# Patient Record
Sex: Male | Born: 1970 | Race: White | Hispanic: No | Marital: Married | Smoking: Never smoker
Health system: Southern US, Community
[De-identification: ages and names within clinical notes are randomized; demographics above are authoritative.]

## PROBLEM LIST (undated history)

## (undated) DIAGNOSIS — L409 Psoriasis, unspecified: Secondary | ICD-10-CM

## (undated) DIAGNOSIS — E162 Hypoglycemia, unspecified: Secondary | ICD-10-CM

## (undated) DIAGNOSIS — N451 Epididymitis: Secondary | ICD-10-CM

## (undated) DIAGNOSIS — T7840XA Allergy, unspecified, initial encounter: Secondary | ICD-10-CM

## (undated) HISTORY — DX: Hypoglycemia, unspecified: E16.2

## (undated) HISTORY — DX: Epididymitis: N45.1

## (undated) HISTORY — DX: Allergy, unspecified, initial encounter: T78.40XA

## (undated) HISTORY — DX: Psoriasis, unspecified: L40.9

---

## 2011-03-19 ENCOUNTER — Other Ambulatory Visit: Payer: Self-pay | Admitting: Family Medicine

## 2011-03-19 ENCOUNTER — Ambulatory Visit (HOSPITAL_COMMUNITY)
Admission: RE | Admit: 2011-03-19 | Discharge: 2011-03-19 | Disposition: A | Discharge status: Home / Self Care | Payer: BC Managed Care – PPO | Source: Ambulatory Visit | Attending: Family Medicine | Admitting: Family Medicine

## 2011-03-19 DIAGNOSIS — N50819 Testicular pain, unspecified: Secondary | ICD-10-CM

## 2011-03-19 DIAGNOSIS — N433 Hydrocele, unspecified: Secondary | ICD-10-CM | POA: Insufficient documentation

## 2011-03-19 DIAGNOSIS — N509 Disorder of male genital organs, unspecified: Secondary | ICD-10-CM | POA: Insufficient documentation

## 2011-03-26 ENCOUNTER — Ambulatory Visit (INDEPENDENT_AMBULATORY_CARE_PROVIDER_SITE_OTHER): Payer: BC Managed Care – PPO | Admitting: Family Medicine

## 2011-03-26 ENCOUNTER — Encounter: Payer: Self-pay | Admitting: Family Medicine

## 2011-03-26 VITALS — BP 120/78 | HR 65 | Temp 98.8°F | Ht 68.5 in | Wt 170.0 lb

## 2011-03-26 DIAGNOSIS — Z0000 Encounter for general adult medical examination without abnormal findings: Secondary | ICD-10-CM

## 2011-03-26 NOTE — Progress Notes (Signed)
  Subjective:    Patient ID: Trevor Hernandez, male    DOB: Jul 16, 1971, 40 y.o.   MRN: [id number]  HPI 40 yr old male to establish with [place] and for a cpx. He feels fine in general. He has chronic epididymitis, and he established with Urology earlier this week. They did a scrotal US that was normal. He gets frequent testicular when he runs a lot or overexerts himself. He is active and exercises regularly. 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 He moved to [place] from [place], [place] just 2 months ago.    Review of Systems  Constitutional: Negative.   HENT: Negative.   Eyes: Negative.   Respiratory: Negative.   Cardiovascular: Negative.   Gastrointestinal: Negative.   Genitourinary: Negative.   Musculoskeletal: Negative.   Skin: Negative.   Neurological: Negative.   Hematological: Negative.   Psychiatric/Behavioral: Negative.        Objective:   Physical Exam  Constitutional: He is oriented to person, place, and time. He appears well-developed and well-nourished. No distress.  HENT:  Head: Normocephalic and atraumatic.  Right Ear: External ear normal.  Left Ear: External ear normal.  Nose: Nose normal.  Mouth/Throat: Oropharynx is clear and moist. No oropharyngeal exudate.  Eyes: Conjunctivae and EOM are normal. Pupils are equal, round, and reactive to light. Right eye exhibits no discharge. Left eye exhibits no discharge. No scleral icterus.  Neck: Neck supple. No JVD present. No tracheal deviation present. No thyromegaly present.  Cardiovascular: Normal rate, regular rhythm, normal heart sounds and intact distal pulses.  Exam reveals no gallop and no friction rub.   No murmur heard. Pulmonary/Chest: Effort normal and breath sounds normal. No respiratory distress. He has no wheezes. He has no rales. He exhibits no tenderness.  Abdominal: Soft. Bowel sounds are normal. He exhibits no distension and no mass. There is no tenderness. There is no rebound and  no guarding.  Musculoskeletal: Normal range of motion. He exhibits no edema and no tenderness.  Lymphadenopathy:    He has no cervical adenopathy.  Neurological: He is alert and oriented to person, place, and time. He has normal reflexes. No cranial nerve deficit. He exhibits normal muscle tone. Coordination normal.  Skin: Skin is warm and dry. No rash noted. He is not diaphoretic. No erythema. No pallor.  Psychiatric: He has a normal mood and affect. His behavior is normal. Judgment and thought content normal.          Assessment & Plan:  Well exam. He will return soon for fasting labs.

## 2011-04-01 ENCOUNTER — Ambulatory Visit: Payer: BC Managed Care – PPO

## 2011-04-01 ENCOUNTER — Other Ambulatory Visit: Payer: BC Managed Care – PPO

## 2011-04-01 ENCOUNTER — Other Ambulatory Visit: Payer: Self-pay | Admitting: Family Medicine

## 2011-04-01 DIAGNOSIS — Z0000 Encounter for general adult medical examination without abnormal findings: Secondary | ICD-10-CM

## 2011-04-01 LAB — URINALYSIS
Bilirubin Urine: NEGATIVE
Hgb urine dipstick: NEGATIVE
Leukocytes, UA: NEGATIVE
Nitrite: NEGATIVE
Total Protein, Urine: NEGATIVE
Urobilinogen, UA: 0.2 (ref 0.0–1.0)

## 2011-04-01 LAB — CBC WITH DIFFERENTIAL/PLATELET
Basophils Absolute: 0 10*3/uL (ref 0.0–0.1)
HCT: 45.7 % (ref 39.0–52.0)
Hemoglobin: 15.8 g/dL (ref 13.0–17.0)
Lymphs Abs: 2.1 10*3/uL (ref 0.7–4.0)
MCHC: 34.5 g/dL (ref 30.0–36.0)
MCV: 94.5 fl (ref 78.0–100.0)
Monocytes Absolute: 0.5 10*3/uL (ref 0.1–1.0)
Monocytes Relative: 7.7 % (ref 3.0–12.0)
Neutro Abs: 3.3 10*3/uL (ref 1.4–7.7)
Platelets: 213 10*3/uL (ref 150.0–400.0)
RDW: 12.5 % (ref 11.5–14.6)

## 2011-04-01 LAB — BASIC METABOLIC PANEL
BUN: 12 mg/dL (ref 6–23)
CO2: 30 mEq/L (ref 19–32)
Chloride: 104 mEq/L (ref 96–112)
Glucose, Bld: 90 mg/dL (ref 70–99)
Potassium: 4.8 mEq/L (ref 3.5–5.1)
Sodium: 141 mEq/L (ref 135–145)

## 2011-04-01 LAB — LIPID PANEL
HDL: 54.2 mg/dL (ref >39.00)
LDL Cholesterol: 78 mg/dL (ref 0–99)
Total CHOL/HDL Ratio: 3
VLDL: 12.4 mg/dL (ref 0.0–40.0)

## 2011-04-01 LAB — TSH: TSH: 0.85 u[IU]/mL (ref 0.35–5.50)

## 2011-04-01 LAB — HEPATIC FUNCTION PANEL
ALT: 13 U/L (ref 0–53)
Bilirubin, Direct: 0.2 mg/dL (ref 0.0–0.3)
Total Bilirubin: 1.2 mg/dL (ref 0.3–1.2)

## 2012-01-16 ENCOUNTER — Telehealth: Payer: Self-pay | Admitting: Family Medicine

## 2012-01-16 DIAGNOSIS — Z0000 Encounter for general adult medical examination without abnormal findings: Secondary | ICD-10-CM

## 2012-01-16 NOTE — Telephone Encounter (Signed)
Add a PSA for V70.0

## 2012-01-16 NOTE — Telephone Encounter (Signed)
Pt has sch a cpx in July and is req to get psa cancer screening added to cpx labs. Pls order necessary labs and advise.

## 2012-01-19 NOTE — Telephone Encounter (Signed)
Added ordered psa to pts cpx labs and notified pt as noted.

## 2012-01-19 NOTE — Telephone Encounter (Signed)
I put the future lab order in, can you call pt and make sure that he does have the lab appointment scheduled?

## 2012-02-12 ENCOUNTER — Other Ambulatory Visit (INDEPENDENT_AMBULATORY_CARE_PROVIDER_SITE_OTHER): Payer: BC Managed Care – PPO

## 2012-02-12 DIAGNOSIS — Z0000 Encounter for general adult medical examination without abnormal findings: Secondary | ICD-10-CM

## 2012-02-12 LAB — POCT URINALYSIS DIPSTICK
Blood, UA: NEGATIVE
Glucose, UA: NEGATIVE
Leukocytes, UA: NEGATIVE
Nitrite, UA: NEGATIVE
Urobilinogen, UA: 0.2
pH, UA: 7.5

## 2012-02-12 LAB — LIPID PANEL
Cholesterol: 174 mg/dL (ref 0–200)
HDL: 58.2 mg/dL (ref >39.00)
LDL Cholesterol: 102 mg/dL — ABNORMAL HIGH (ref 0–99)
Total CHOL/HDL Ratio: 3
Triglycerides: 71 mg/dL (ref 0.0–149.0)
VLDL: 14.2 mg/dL (ref 0.0–40.0)

## 2012-02-12 LAB — BASIC METABOLIC PANEL
BUN: 17 mg/dL (ref 6–23)
Calcium: 9 mg/dL (ref 8.4–10.5)
Creatinine, Ser: 0.9 mg/dL (ref 0.4–1.5)
GFR: 98.09 mL/min (ref >60.00)

## 2012-02-12 LAB — CBC WITH DIFFERENTIAL/PLATELET
Basophils Absolute: 0 10*3/uL (ref 0.0–0.1)
Eosinophils Absolute: 0.2 10*3/uL (ref 0.0–0.7)
Hemoglobin: 15.8 g/dL (ref 13.0–17.0)
Lymphocytes Relative: 37.4 % (ref 12.0–46.0)
MCHC: 34.1 g/dL (ref 30.0–36.0)
Monocytes Relative: 6.9 % (ref 3.0–12.0)
Neutro Abs: 3.3 10*3/uL (ref 1.4–7.7)
Platelets: 200 10*3/uL (ref 150.0–400.0)
RDW: 12.1 % (ref 11.5–14.6)

## 2012-02-12 LAB — PSA: PSA: 0.32 ng/mL (ref 0.10–4.00)

## 2012-02-12 LAB — HEPATIC FUNCTION PANEL
ALT: 14 U/L (ref 0–53)
Bilirubin, Direct: 0.1 mg/dL (ref 0.0–0.3)
Total Bilirubin: 1.3 mg/dL — ABNORMAL HIGH (ref 0.3–1.2)

## 2012-02-16 ENCOUNTER — Telehealth: Payer: Self-pay | Admitting: Family Medicine

## 2012-02-16 ENCOUNTER — Encounter: Payer: Self-pay | Admitting: Family Medicine

## 2012-02-16 ENCOUNTER — Ambulatory Visit (INDEPENDENT_AMBULATORY_CARE_PROVIDER_SITE_OTHER): Payer: BC Managed Care – PPO | Admitting: Family Medicine

## 2012-02-16 ENCOUNTER — Ambulatory Visit: Payer: BC Managed Care – PPO | Admitting: Internal Medicine

## 2012-02-16 VITALS — BP 118/74 | HR 62 | Temp 98.5°F | Wt 166.0 lb

## 2012-02-16 DIAGNOSIS — N419 Inflammatory disease of prostate, unspecified: Secondary | ICD-10-CM

## 2012-02-16 DIAGNOSIS — N390 Urinary tract infection, site not specified: Secondary | ICD-10-CM

## 2012-02-16 LAB — POCT URINALYSIS DIPSTICK
Ketones, UA: NEGATIVE
Protein, UA: NEGATIVE
Spec Grav, UA: 1.01
Urobilinogen, UA: 0.2
pH, UA: 6

## 2012-02-16 MED ORDER — CIPROFLOXACIN HCL 500 MG PO TABS: 500.0000 mg | ORAL_TABLET | Freq: Two times a day (BID) | ORAL | Status: AC

## 2012-02-16 NOTE — Telephone Encounter (Signed)
Caller: Trevor Hernandez/Patient; PCP: Nelwyn Salisbury.; CB#: [phone]; ; ; Call regarding Urinary Pain/Bleeding; Onset 02/02/12; Patient reports mild pain over bladder, severe urgency, frequency. Patient reports that at times he does not have very much output when he goes to the bathroom. Denies blood in urine. Afebrile. Patient reports pain over his abdomen and scrotum as well. Emergent s/s of "Urinary tract symptoms and any flank, low back, penis, or scrotal pain" positive per Urinary Symptoms - Male guideline. Appointment scheduled with Dr. Fabian Sharp 02/16/12 at 1:50pm.

## 2012-02-16 NOTE — Progress Notes (Signed)
  Subjective:    Patient ID: Trevor Hernandez, male    DOB: 10/27/1970, 41 y.o.   MRN: [id number]  HPI Here for 10 days of increased frequency and urgency to urinate. No pain or urethral DC. No fevers. He has never had this before. He had recent cpx labs which included a normal UA and a normal PSA.    Review of Systems  Constitutional: Negative.   Gastrointestinal: Negative.   Genitourinary: Positive for urgency and frequency. Negative for dysuria, hematuria, flank pain, discharge, difficulty urinating and testicular pain.       Objective:   Physical Exam  Constitutional: He appears well-developed and well-nourished.  Genitourinary: Rectum normal and penis normal. Right testis shows no mass, no swelling and no tenderness. Left testis shows no mass, no swelling and no tenderness.       The prostate is swollen, boggy, and tender. No nodules           Assessment & Plan:  Prostatitis. Treat with Cipro. We will recheck at his cpx later this week

## 2012-02-16 NOTE — Progress Notes (Signed)
Quick Note:  I left voice message with normal results. ______ 

## 2012-02-18 ENCOUNTER — Ambulatory Visit (INDEPENDENT_AMBULATORY_CARE_PROVIDER_SITE_OTHER): Payer: BC Managed Care – PPO | Admitting: Family Medicine

## 2012-02-18 ENCOUNTER — Encounter: Payer: Self-pay | Admitting: Family Medicine

## 2012-02-18 VITALS — BP 110/78 | HR 100 | Temp 98.4°F | Ht 67.5 in | Wt 162.0 lb

## 2012-02-18 DIAGNOSIS — Z0000 Encounter for general adult medical examination without abnormal findings: Secondary | ICD-10-CM

## 2012-02-18 NOTE — Progress Notes (Signed)
  Subjective:    Patient ID: Trevor Hernandez, male    DOB: 1971/02/26, 41 y.o.   MRN: [id number]  HPI 41 yr old male for a cpx. He feels well and has no concerns. We saw him several days ago for a prostate infection, and he already feels much better on Cipro.    Review of Systems  Constitutional: Negative.   HENT: Negative.   Eyes: Negative.   Respiratory: Negative.   Cardiovascular: Negative.   Gastrointestinal: Negative.   Genitourinary: Negative.   Musculoskeletal: Negative.   Skin: Negative.   Neurological: Negative.   Hematological: Negative.   Psychiatric/Behavioral: Negative.        Objective:   Physical Exam  Constitutional: He is oriented to person, place, and time. He appears well-developed and well-nourished. No distress.  HENT:  Head: Normocephalic and atraumatic.  Right Ear: External ear normal.  Left Ear: External ear normal.  Nose: Nose normal.  Mouth/Throat: Oropharynx is clear and moist. No oropharyngeal exudate.  Eyes: Conjunctivae and EOM are normal. Pupils are equal, round, and reactive to light. Right eye exhibits no discharge. Left eye exhibits no discharge. No scleral icterus.  Neck: Neck supple. No JVD present. No tracheal deviation present. No thyromegaly present.  Cardiovascular: Normal rate, regular rhythm, normal heart sounds and intact distal pulses.  Exam reveals no gallop and no friction rub.   No murmur heard. Pulmonary/Chest: Effort normal and breath sounds normal. No respiratory distress. He has no wheezes. He has no rales. He exhibits no tenderness.  Abdominal: Soft. Bowel sounds are normal. He exhibits no distension and no mass. There is no tenderness. There is no rebound and no guarding.  Genitourinary: Rectum normal, prostate normal and penis normal. Guaiac negative stool. No penile tenderness.  Musculoskeletal: Normal range of motion. He exhibits no edema and no tenderness.  Lymphadenopathy:    He has no cervical adenopathy.    Neurological: He is alert and oriented to person, place, and time. He has normal reflexes. No cranial nerve deficit. He exhibits normal muscle tone. Coordination normal.  Skin: Skin is warm and dry. No rash noted. He is not diaphoretic. No erythema. No pallor.  Psychiatric: He has a normal mood and affect. His behavior is normal. Judgment and thought content normal.          Assessment & Plan:  Well exam. Finish out the antibiotic.

## 2012-09-18 ENCOUNTER — Encounter: Payer: Self-pay | Admitting: Internal Medicine

## 2012-09-18 ENCOUNTER — Ambulatory Visit (INDEPENDENT_AMBULATORY_CARE_PROVIDER_SITE_OTHER): Payer: BC Managed Care – PPO | Admitting: Internal Medicine

## 2012-09-18 VITALS — BP 122/74 | HR 93 | Temp 98.7°F | Wt 167.0 lb

## 2012-09-18 DIAGNOSIS — J019 Acute sinusitis, unspecified: Secondary | ICD-10-CM

## 2012-09-18 MED ORDER — DOXYCYCLINE HYCLATE 100 MG PO TABS: 100.0000 mg | ORAL_TABLET | Freq: Two times a day (BID) | ORAL | Status: DC

## 2012-09-18 NOTE — Patient Instructions (Addendum)
Use over-the-counter  "cold" medicines  such as "Tylenol cold" , "Advil cold",  "Mucinex" or" Mucinex D"  for cough and congestion.   Avoid decongestants if you have high blood pressure and use "Afrin" nasal spray for nasal congestion as directed instead. Use" Delsym" or" Robitussin" cough syrup varietis for cough.  You can use plain "Tylenol" or "Advil" for fever, chills and achyness.  Please, make an appointment if you are not better or if you're worse.  

## 2012-09-18 NOTE — Progress Notes (Signed)
  Subjective:    Patient ID: Trevor Hernandez, male    DOB: 04/22/1971, 42 y.o.   MRN: [id number]  Cough This is a new problem. The current episode started in the past 7 days. The problem has been gradually worsening. The cough is productive of sputum. Associated symptoms include chills. Pertinent negatives include no fever, rash or wheezing. He has tried OTC cough suppressant for the symptoms. The treatment provided mild relief. There is no history of asthma.      Review of Systems  Constitutional: Positive for chills. Negative for fever.  Respiratory: Positive for cough. Negative for wheezing.   Skin: Negative for rash.       Objective:   Physical Exam  Constitutional: He is oriented to person, place, and time. He appears well-developed. No distress.  HENT:  Mouth/Throat: Oropharynx is clear and moist.  Eyes: Conjunctivae are normal. Pupils are equal, round, and reactive to light. Right eye exhibits no discharge.  eryth throat  Neck: Normal range of motion. No JVD present. No thyromegaly present.  Cardiovascular: Normal rate, regular rhythm, normal heart sounds and intact distal pulses.  Exam reveals no gallop and no friction rub.   No murmur heard. Pulmonary/Chest: Effort normal and breath sounds normal. No respiratory distress. He has no wheezes. He has no rales. He exhibits no tenderness.  Abdominal: Soft. Bowel sounds are normal. He exhibits no distension and no mass. There is no tenderness. There is no rebound and no guarding.  Musculoskeletal: Normal range of motion. He exhibits no edema and no tenderness.  Lymphadenopathy:    He has no cervical adenopathy.  Neurological: He is alert and oriented to person, place, and time. He has normal reflexes. No cranial nerve deficit. He exhibits normal muscle tone. Coordination normal.  Skin: Skin is warm and dry. No rash noted.  Psychiatric: He has a normal mood and affect. His behavior is normal. Judgment and thought content normal.           Assessment & Plan:

## 2012-09-20 ENCOUNTER — Telehealth: Payer: Self-pay | Admitting: Family Medicine

## 2012-09-20 NOTE — Telephone Encounter (Signed)
[facility] Triage Call Report Triage Record Num: [id number] Operator: Hyman Bower Patient Name: Trevor Hernandez Call Date & Time: 09/18/2012 8:32:24AM Patient Phone: [phone] PCP: Tera Mater. Clent Ridges Patient Gender: Male PCP Fax : [phone] Patient DOB: 1970/10/28 [facility] Name[facility] Reason for Call: Caller: Randy/Patient; PCP: Gershon Crane [facility]); CB#: [phone]; Call regarding Cough/Congestion, had the cough x 10 days, coughed up some blood, low grade fever, feels dizzy with body aches. Patient has received a flu shot this season. Patient reports that during the night he did have some shortness of breath and wheezing, but that has dissipated this morning since he is now up and walking around. Patient reports he does have dizziness at times, but he can still walk like normal. Reports sinus pain and pressure. Triaged per Upper Respiratory Infection guideline, disposition: see provider within 24 hours for "Pressure/pain above or below eyes, near ears over sinuses and yellow-green drainage from nose or down back of throat." Appointment scheduled 09/18/12 at 11:00am with Dr. Posey Rea. Advised patient to call back if any difficulty breathing or severe dizziness occurs. Patient verbalized understanding. Protocol(s) Used: Flu-Like Symptoms Protocol(s) Used: Upper Respiratory Infection (URI) Recommended Outcome per Protocol: See Provider within 24 hours Reason for Outcome: Gradual onset of upper respiratory illness signs and symptoms (If there is any doubt that symptoms may be an upper respiratory illness, use this guideline, Flu-Like Symptoms ) Pressure/pain above or below eyes, near ears over sinuses (may also be described as fullness, worsens when bending forward, teeth or eye pain) AND yellow-green drainage from nose or down back of throat. Care Advice: ~ 02/15/

## 2013-02-11 ENCOUNTER — Telehealth: Payer: Self-pay | Admitting: Family Medicine

## 2013-02-11 NOTE — Telephone Encounter (Signed)
I spoke to someone that answered the phone. Pt did not feel like talking. I advised that if his nose starts bleeding again, he needs to go to Urgent Care. We do not have any openings here at the office today.

## 2013-02-11 NOTE — Telephone Encounter (Signed)
Patient Information:  Caller Name: Harvie Heck  Phone: [phone]  Patient: Trevor Hernandez, Trevor Hernandez  Gender: Male  DOB: 20-Jul-1971  Age: 41 Years  PCP: Gershon Crane [facility])  Office Follow Up:  Does the office need to follow up with this patient?: Yes  Instructions For The Office: Pt of Dr. Clent Ridges. No appts available today for that MD. RN does not have permission to schedule with other Provider. Please call and advise pt.   Symptoms  Reason For Call & Symptoms: Pt has had 3 hard to stop nosebleeds over the past 3 days. No fever. No cold sx. No hx of nosebleeds.  Reviewed Health History In EMR: Yes  Reviewed Medications In EMR: Yes  Reviewed Allergies In EMR: Yes  Reviewed Surgeries / Procedures: Yes  Date of Onset of Symptoms: 02/08/2013  Guideline(s) Used:  Nosebleed  Disposition Per Guideline:   See Today in Office  Reason For Disposition Reached:   Bleeding recurs 3 or more times in 24 hours despite direct pressure  Advice Given:  N/A  Patient Will Follow Care Advice:  YES

## 2013-02-12 ENCOUNTER — Ambulatory Visit (INDEPENDENT_AMBULATORY_CARE_PROVIDER_SITE_OTHER): Payer: BC Managed Care – PPO | Admitting: Family Medicine

## 2013-02-12 ENCOUNTER — Encounter: Payer: Self-pay | Admitting: Family Medicine

## 2013-02-12 VITALS — BP 124/90 | Temp 97.3°F | Wt 163.0 lb

## 2013-02-12 DIAGNOSIS — R040 Epistaxis: Secondary | ICD-10-CM

## 2013-02-12 NOTE — Progress Notes (Signed)
   Nature conservation officer at [facility]: [place] [address] [place] [address] Phone: [phone] Fax: [phone]  Date:  02/12/2013   Name:  Sanchez Hemmer   DOB:  [birth year]   MRN:  [id number] Gender: male Age: 42 y.o.  Primary Physician:  Nelwyn Salisbury, MD  Evaluating MD: Hannah Beat, MD   Chief Complaint: Epistaxis   History of Present Illness:  Arien Benincasa is a 42 y.o. pleasant patient who presents with the following:  3 days ago, blood came gushing out. Never had bad one before. Cleaned up in about 30 minutes. Blew very lightly and very lightly. Felt some loose mucous. Big one again.     Patient Active Problem List   Diagnosis Date Noted  . Acute sinusitis 09/18/2012    Past Medical History  Diagnosis Date  . Epididymitis     sees Dr. Natalia Leatherwood     No past surgical history on file.  History   Social History  . Marital Status: Married    Spouse Name: N/A    Number of Children: N/A  . Years of Education: N/A   Occupational History  . Not on file.   Social History Main Topics  . Smoking status: Never Smoker   . Smokeless tobacco: Never Used  . Alcohol Use: 3.5 oz/week    7 drink(s) per week  . Drug Use: No  . Sexually Active: Yes   Other Topics Concern  . Not on file   Social History Narrative  . No narrative on file    Family History  Problem Relation Age of Onset  . Diabetes Father     Allergies  Allergen Reactions  . Penicillins     Pt's wife is highly sensitive, so he would not want to take this.    Medication list has been reviewed and updated.  Outpatient Prescriptions Prior to Visit  Medication Sig Dispense Refill  . doxycycline (VIBRA-TABS) 100 MG tablet Take 1 tablet (100 mg total) by mouth 2 (two) times daily.  20 tablet  0   No facility-administered medications prior to visit.    Review of Systems:   GEN: No acute illnesses, no fevers, chills. GI: No n/v/d, eating normally Pulm: No SOB Interactive  and getting along well at home.  Otherwise, ROS is as per the HPI.   Physical Examination: BP 124/90  Temp(Src) 97.3 F (36.3 C) (Oral)  Wt 163 lb (73.936 kg)  BMI 25.14 kg/m2  Ideal Body Weight:     GEN: WDWN, NAD, Non-toxic, Alert & Oriented x 3 HEENT: Atraumatic, Normocephalic. Scant blood clot in L nares Ears and Nose: No external deformity. EXTR: No clubbing/cyanosis/edema NEURO: Normal gait.  PSYCH: Normally interactive. Conversant. Not depressed or anxious appearing.  Calm demeanor.    Assessment and Plan: Epistaxis   Afrin bid x 3 days Reviewed how to compress bride of nose Tea bag if he has them at the time  If recurrent cautery likely next step  Signed, Rilynne Lonsway T. Joni Colegrove, MD 02/12/2013 9:45 AM

## 2013-04-13 ENCOUNTER — Encounter: Payer: Self-pay | Admitting: Family Medicine

## 2013-04-13 ENCOUNTER — Ambulatory Visit (INDEPENDENT_AMBULATORY_CARE_PROVIDER_SITE_OTHER): Payer: BC Managed Care – PPO | Admitting: Family Medicine

## 2013-04-13 VITALS — BP 120/80 | HR 61 | Temp 98.3°F | Ht 67.75 in | Wt 166.0 lb

## 2013-04-13 DIAGNOSIS — R05 Cough: Secondary | ICD-10-CM

## 2013-04-13 DIAGNOSIS — Z0000 Encounter for general adult medical examination without abnormal findings: Secondary | ICD-10-CM

## 2013-04-13 DIAGNOSIS — Z23 Encounter for immunization: Secondary | ICD-10-CM

## 2013-04-13 LAB — LIPID PANEL
Total CHOL/HDL Ratio: 3
Triglycerides: 90 mg/dL (ref 0.0–149.0)

## 2013-04-13 LAB — HEPATIC FUNCTION PANEL
ALT: 15 U/L (ref 0–53)
AST: 18 U/L (ref 0–37)
Bilirubin, Direct: 0.2 mg/dL (ref 0.0–0.3)
Total Bilirubin: 1.3 mg/dL — ABNORMAL HIGH (ref 0.3–1.2)

## 2013-04-13 LAB — CBC WITH DIFFERENTIAL/PLATELET
Basophils Relative: 0.2 % (ref 0.0–3.0)
Eosinophils Absolute: 0.1 10*3/uL (ref 0.0–0.7)
MCHC: 34.2 g/dL (ref 30.0–36.0)
MCV: 94 fl (ref 78.0–100.0)
Monocytes Absolute: 0.6 10*3/uL (ref 0.1–1.0)
Neutrophils Relative %: 66.7 % (ref 43.0–77.0)
RBC: 4.96 Mil/uL (ref 4.22–5.81)
RDW: 12.5 % (ref 11.5–14.6)

## 2013-04-13 LAB — POCT URINALYSIS DIPSTICK
Blood, UA: NEGATIVE
Ketones, UA: NEGATIVE
Protein, UA: NEGATIVE
Spec Grav, UA: 1.01
pH, UA: 6.5

## 2013-04-13 LAB — BASIC METABOLIC PANEL
CO2: 27 mEq/L (ref 19–32)
Chloride: 102 mEq/L (ref 96–112)
Creatinine, Ser: 0.8 mg/dL (ref 0.4–1.5)
Sodium: 137 mEq/L (ref 135–145)

## 2013-04-13 MED ORDER — TRIAMCINOLONE ACETONIDE 0.1 % EX CREA: TOPICAL_CREAM | Freq: Two times a day (BID) | CUTANEOUS | Status: DC

## 2013-04-13 NOTE — Progress Notes (Signed)
  Subjective:    Patient ID: Trevor Hernandez, male    DOB: 1970-08-30, 42 y.o.   MRN: [id number]  HPI 42 yr old male for a cpx. He feels well exept for 2 things. First he has a chronic cough that started 7 years ago after he had a left sided pneumonia. Since then every day or two when he gets up in the mornings he feels a rattle or some congestion in the left chest area. He then coughs up a small amount of clear or yellow sputum, and the congestion goes away. He never sees blood or dark sputum. No chest pain or fever. He has never used tobacco. Second for several years he has had small itchy scaly spots of skin on the front of both knees. No other skin rashes.    Review of Systems  Constitutional: Negative.   HENT: Negative.   Eyes: Negative.   Respiratory: Positive for cough. Negative for apnea, choking, chest tightness, shortness of breath, wheezing and stridor.   Cardiovascular: Negative.   Gastrointestinal: Negative.   Genitourinary: Negative.   Musculoskeletal: Negative.   Skin: Positive for rash. Negative for color change, pallor and wound.  Neurological: Negative.   Psychiatric/Behavioral: Negative.        Objective:   Physical Exam  Constitutional: He is oriented to person, place, and time. He appears well-developed and well-nourished. No distress.  HENT:  Head: Normocephalic and atraumatic.  Right Ear: External ear normal.  Left Ear: External ear normal.  Nose: Nose normal.  Mouth/Throat: Oropharynx is clear and moist. No oropharyngeal exudate.  Eyes: Conjunctivae and EOM are normal. Pupils are equal, round, and reactive to light. Right eye exhibits no discharge. Left eye exhibits no discharge. No scleral icterus.  Neck: Neck supple. No JVD present. No tracheal deviation present. No thyromegaly present.  Cardiovascular: Normal rate, regular rhythm, normal heart sounds and intact distal pulses.  Exam reveals no gallop and no friction rub.   No murmur  heard. Pulmonary/Chest: Effort normal and breath sounds normal. No respiratory distress. He has no wheezes. He has no rales. He exhibits no tenderness.  Abdominal: Soft. Bowel sounds are normal. He exhibits no distension and no mass. There is no tenderness. There is no rebound and no guarding.  Genitourinary: Rectum normal, prostate normal and penis normal. Guaiac negative stool. No penile tenderness.  Musculoskeletal: Normal range of motion. He exhibits no edema and no tenderness.  Lymphadenopathy:    He has no cervical adenopathy.  Neurological: He is alert and oriented to person, place, and time. He has normal reflexes. No cranial nerve deficit. He exhibits normal muscle tone. Coordination normal.  Skin: Skin is warm and dry. He is not diaphoretic. No erythema. No pallor.  Both anterior knees have a small patch of macular pink scaly skin   Psychiatric: He has a normal mood and affect. His behavior is normal. Judgment and thought content normal.          Assessment & Plan:  wll exam. Get fasting labs. He may have some scarring in the lung from the pneumonia, but we will get a CXR soon to look at this. He has mild psoriasis, and he will use Triamcinolone cream on this prn.

## 2013-04-14 ENCOUNTER — Ambulatory Visit (INDEPENDENT_AMBULATORY_CARE_PROVIDER_SITE_OTHER)
Admission: RE | Admit: 2013-04-14 | Discharge: 2013-04-14 | Disposition: A | Discharge status: Home / Self Care | Payer: BC Managed Care – PPO | Source: Ambulatory Visit | Attending: Family Medicine | Admitting: Family Medicine

## 2013-04-14 DIAGNOSIS — R05 Cough: Secondary | ICD-10-CM

## 2013-04-15 NOTE — Progress Notes (Signed)
Quick Note:  I left voice message with results. ______ 

## 2013-06-14 ENCOUNTER — Encounter: Payer: Self-pay | Admitting: Family Medicine

## 2013-06-14 ENCOUNTER — Ambulatory Visit (INDEPENDENT_AMBULATORY_CARE_PROVIDER_SITE_OTHER): Payer: BC Managed Care – PPO | Admitting: Family Medicine

## 2013-06-14 ENCOUNTER — Telehealth: Payer: Self-pay | Admitting: Family Medicine

## 2013-06-14 VITALS — BP 110/78 | HR 85 | Temp 98.4°F | Wt 168.0 lb

## 2013-06-14 DIAGNOSIS — R002 Palpitations: Secondary | ICD-10-CM

## 2013-06-14 NOTE — Progress Notes (Signed)
Pre visit review using our clinic review tool, if applicable. No additional management support is needed unless otherwise documented below in the visit note. 

## 2013-06-14 NOTE — Progress Notes (Signed)
  Subjective:    Patient ID: Trevor Hernandez, male    DOB: Jan 13, 1971, 42 y.o.   MRN: [id number]  HPI Here for an episode of palpitations last night. He has never felt this before. He is active and swims for exercise at least 5 days a week. He was here for a cpx with labs 2 months ago, and everything came out fine. He did consume a lot more caffeine than usual yesterday. He had 3 cups of coffee in the morning and then had a few more in the afternoon, which is quite unusual for him. He went to sleep and then about one hour later was awakened by fluttering in the chest. He had no chest pain or SOB. He did feel a little faint and a little nauseated briefly but all this resolved in about 15 minutes. He went back to sleep and woke up this morning feeling fine. He has felt fine ever since.    Review of Systems  Constitutional: Negative.   Respiratory: Negative.   Cardiovascular: Positive for palpitations. Negative for chest pain and leg swelling.  Neurological: Negative.        Objective:   Physical Exam  Constitutional: He appears well-developed and well-nourished. No distress.  Neck: No thyromegaly present.  Cardiovascular: Normal rate, regular rhythm, normal heart sounds and intact distal pulses.   EKG is normal   Pulmonary/Chest: Effort normal and breath sounds normal.  Lymphadenopathy:    He has no cervical adenopathy.          Assessment & Plan:  He had a transient episode of palpitations which was likely triggered by consuming more caffeine than usual. He seems back to normal now. He will rest today and avoid using much caffeine. Recheck prn

## 2013-06-14 NOTE — Telephone Encounter (Signed)
[facility] Triage Call Report Triage Record Num: [id number] Operator: Migdalia Dk Patient Name: Trevor Hernandez Call Date & Time: 06/14/2013 6:12:20AM Patient Phone: [phone] PCP: Tera Mater. Clent Ridges Patient Gender: Male PCP Fax : [phone] Patient DOB: Sep 26, 1970 [facility] Name[facility] Reason for Call: Caller: Stanford Breed; PCP: Nelwyn Salisbury.; CB#: [phone]; Call regarding Heart palpitations; Woke out of sleep with heart racing, and toes tingling this a.m. " I got up and when I did I got dizzy and had to lay down on the floor." Episode of dizziness 10-15 minutes, "my heartrate stayed off for about an hour, but then it went away." No palpitations this a.m., no chest pain, no diff. breathing. Patient with no symptoms at all now. Appointment with Dr. Abran Cantor given today at 0930. Protocol(s) Used: Irregular Heartbeat Recommended Outcome per Protocol: See Provider within 2 Weeks Reason for Outcome: All other situations Care Advice: ~

## 2013-08-15 ENCOUNTER — Ambulatory Visit (INDEPENDENT_AMBULATORY_CARE_PROVIDER_SITE_OTHER): Payer: BC Managed Care – PPO | Admitting: Family Medicine

## 2013-08-15 ENCOUNTER — Encounter: Payer: Self-pay | Admitting: Family Medicine

## 2013-08-15 VITALS — BP 114/70 | HR 66 | Temp 98.0°F | Wt 165.0 lb

## 2013-08-15 DIAGNOSIS — R059 Cough, unspecified: Secondary | ICD-10-CM

## 2013-08-15 DIAGNOSIS — R05 Cough: Secondary | ICD-10-CM

## 2013-08-15 NOTE — Progress Notes (Signed)
   Subjective:    Patient ID: Trevor Hernandez, male    DOB: 08-25-1970, 43 y.o.   MRN: [id number]  HPI Here to discuss a chronic patch of congestion in the left chest which often makes him cough. This has been present for about a year. The cough is dry. No chest pain or fever. We got a CXR last year which was normal.    Review of Systems  Constitutional: Negative.   Respiratory: Positive for cough and chest tightness. Negative for wheezing.   Cardiovascular: Negative.   Gastrointestinal: Negative.        Objective:   Physical Exam  Constitutional: He appears well-developed and well-nourished. No distress.  Cardiovascular: Normal rate, regular rhythm, normal heart sounds and intact distal pulses.   Pulmonary/Chest: Effort normal and breath sounds normal.          Assessment & Plan:  We will evaluate with a chest CT.

## 2013-08-15 NOTE — Progress Notes (Signed)
Pre visit review using our clinic review tool, if applicable. No additional management support is needed unless otherwise documented below in the visit note. 

## 2013-08-19 NOTE — Addendum Note (Signed)
Addended by: Alysia Penna A on: 08/19/2013 08:52 AM   Modules accepted: Orders

## 2013-08-25 ENCOUNTER — Ambulatory Visit (INDEPENDENT_AMBULATORY_CARE_PROVIDER_SITE_OTHER)
Admission: RE | Admit: 2013-08-25 | Discharge: 2013-08-25 | Disposition: A | Discharge status: Home / Self Care | Payer: BC Managed Care – PPO | Source: Ambulatory Visit | Attending: Family Medicine | Admitting: Family Medicine

## 2013-08-25 DIAGNOSIS — R059 Cough, unspecified: Secondary | ICD-10-CM

## 2013-08-25 DIAGNOSIS — R05 Cough: Secondary | ICD-10-CM

## 2013-09-06 ENCOUNTER — Other Ambulatory Visit: Payer: BC Managed Care – PPO

## 2013-09-07 ENCOUNTER — Inpatient Hospital Stay: Admission: RE | Admit: 2013-09-07 | Payer: BC Managed Care – PPO | Source: Ambulatory Visit

## 2013-09-09 ENCOUNTER — Ambulatory Visit (INDEPENDENT_AMBULATORY_CARE_PROVIDER_SITE_OTHER)
Admission: RE | Admit: 2013-09-09 | Discharge: 2013-09-09 | Disposition: A | Discharge status: Home / Self Care | Payer: BC Managed Care – PPO | Source: Ambulatory Visit | Attending: Family Medicine | Admitting: Family Medicine

## 2013-09-09 DIAGNOSIS — R05 Cough: Secondary | ICD-10-CM

## 2013-09-09 DIAGNOSIS — R059 Cough, unspecified: Secondary | ICD-10-CM

## 2013-09-09 MED ORDER — IOHEXOL 300 MG/ML  SOLN
80.0000 mL | Freq: Once | INTRAMUSCULAR | Status: AC | PRN
  Administered 2013-09-09: 80 mL via INTRAVENOUS

## 2013-09-14 ENCOUNTER — Institutional Professional Consult (permissible substitution): Payer: BC Managed Care – PPO | Admitting: Emergency Medicine

## 2013-09-21 ENCOUNTER — Encounter: Payer: Self-pay | Admitting: Emergency Medicine

## 2013-09-21 ENCOUNTER — Ambulatory Visit (INDEPENDENT_AMBULATORY_CARE_PROVIDER_SITE_OTHER): Payer: BC Managed Care – PPO | Admitting: Emergency Medicine

## 2013-09-21 VITALS — BP 120/78 | HR 59 | Ht 68.0 in | Wt 164.8 lb

## 2013-09-21 DIAGNOSIS — J302 Other seasonal allergic rhinitis: Secondary | ICD-10-CM | POA: Insufficient documentation

## 2013-09-21 DIAGNOSIS — J309 Allergic rhinitis, unspecified: Secondary | ICD-10-CM

## 2013-09-21 NOTE — Progress Notes (Signed)
Subjective:    Patient ID: Trevor Hernandez, male   DOB: July 13, 1971, 43 y.o.   MRN: [id number]  HPI 43 yo never smoker with little PMH, referred today for evaluation of cough. He had a L PNA in  2005 in [place]. He has been well since with the exception of cough and L sided discomfort in setting of viral URI's or flu. For about the last year he has been able to produce a bit of phlegm,  more clearing his throat/chest than true cough. He occasionally has some mild nasal congestion that has been more noticeable since he moved to [place].    Review of Systems  Constitutional: Negative for fever and unexpected weight change.  HENT: Negative for congestion, dental problem, ear pain, nosebleeds, postnasal drip, rhinorrhea, sinus pressure, sneezing, sore throat and trouble swallowing.   Eyes: Negative for redness and itching.  Respiratory: Positive for cough. Negative for chest tightness, shortness of breath and wheezing.   Cardiovascular: Negative for palpitations and leg swelling.  Gastrointestinal: Negative for nausea and vomiting.  Genitourinary: Negative for dysuria.  Musculoskeletal: Negative for joint swelling.  Skin: Negative for rash.  Neurological: Negative for headaches.  Hematological: Does not bruise/bleed easily.  Psychiatric/Behavioral: Negative for dysphoric mood. The patient is not nervous/anxious.     Past Medical History  Diagnosis Date  . Epididymitis     sees Dr. Rolan Bucco      Family History  Problem Relation Age of Onset  . Diabetes Father      History   Social History  . Marital Status: Married    Spouse Name: N/A    Number of Children: N/A  . Years of Education: N/A   Occupational History  . Not on file.   Social History Main Topics  . Smoking status: Never Smoker   . Smokeless tobacco: Never Used  . Alcohol Use: 3.5 oz/week    7 drink(s) per week     Comment: glass of wine or beer in the evening  . Drug Use: No  . Sexual Activity: Yes   Other  Topics Concern  . Not on file   Social History Narrative  . No narrative on file     Allergies  Allergen Reactions  . Penicillins     Pt's wife is highly sensitive, so he would not want to take this.     Outpatient Prescriptions Prior to Visit  Medication Sig Dispense Refill  . triamcinolone cream (KENALOG) 0.1 % Apply topically 2 (two) times daily.  45 g  5   No facility-administered medications prior to visit.         Objective:   Physical Exam Filed Vitals:   09/21/13 1533  BP: 120/78  Pulse: 59  Height: 5\' 8"  (1.727 m)  Weight: 164 lb 12.8 oz (74.753 kg)  SpO2: 99%   Gen: Pleasant, well-nourished, in no distress,  normal affect  ENT: No lesions,  mouth clear,  oropharynx clear, no postnasal drip  Neck: No JVD, no TMG, no carotid bruits  Lungs: No use of accessory muscles, clear without rales or rhonchi  Cardiovascular: RRR, heart sounds normal, no murmur or gallops, no peripheral edema  Musculoskeletal: No deformities, no cyanosis or clubbing  Neuro: alert, non focal  Skin: Warm, no lesions or rashes    09/13/13 --  COMPARISON: None.  FINDINGS:  Mediastinal lymph nodes measure up to 10 mm in the low left  paratracheal station. No hilar or axillary adenopathy. Heart size  normal. No pericardial  effusion.  Biapical pleural parenchymal scarring. Minimal dependent atelectasis  bilaterally. Lungs are otherwise clear. No pleural fluid. Airway is  unremarkable.  Incidental imaging of the upper abdomen shows the visualized  portions of the liver, adrenal glands, kidneys, spleen, pancreas,  stomach and bowel to be grossly unremarkable. No upper abdominal  adenopathy. No worrisome lytic or sclerotic lesions.  IMPRESSION:  No acute findings. No findings to explain the patient's given  symptoms      Assessment & Plan:  Seasonal allergies These are not severe, but he makes enough mucous to be able to localize to the L side of his chest. I suspect that he  clears mucous less effectively from this side due to his prior L CAP in 2005. I can't see an anatomical correlate on CT scan, but this could be too small to see. I suspect that the mucous could be decreased or eliminated by taking an anti-histamine. He is going to try this to see if it helps. I don't think he needs f/u CT scans or any other w/u unless cough or sputum production increase. I discussed with him that he may be at a small increased risk for recurrent L sided PNA at some point in the future.

## 2013-09-21 NOTE — Assessment & Plan Note (Signed)
These are not severe, but he makes enough mucous to be able to localize to the L side of his chest. I suspect that he clears mucous less effectively from this side due to his prior L CAP in 2005. I can't see an anatomical correlate on CT scan, but this could be too small to see. I suspect that the mucous could be decreased or eliminated by taking an anti-histamine. He is going to try this to see if it helps. I don't think he needs f/u CT scans or any other w/u unless cough or sputum production increase. I discussed with him that he may be at a small increased risk for recurrent L sided PNA at some point in the future.

## 2014-02-16 IMAGING — CT CT CHEST W/ CM
2 of 4 series · 15 of 36 positions shown, 18 images · IV contrast (Omnipaque 300)
Comparison: None.

CLINICAL DATA: Productive cough with left chest congestion.

EXAM:
CT CHEST WITH CONTRAST
TECHNIQUE: Multidetector CT imaging of the chest was performed during
intravenous contrast administration.
CONTRAST:  80mL OMNIPAQUE IOHEXOL 300 MG/ML  SOLN

[Series 2: chest routine with · axial · 0.74mm/px · z∈[-344,-58]mm · 12 of 69 slices shown, 15 images]
[im 6/69  mediastinal]
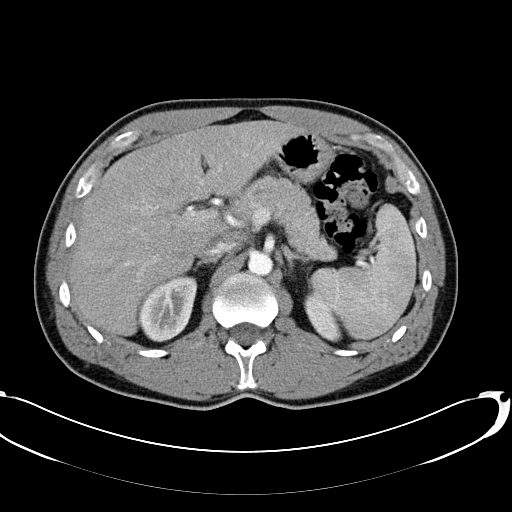
[im 6/69  lung]
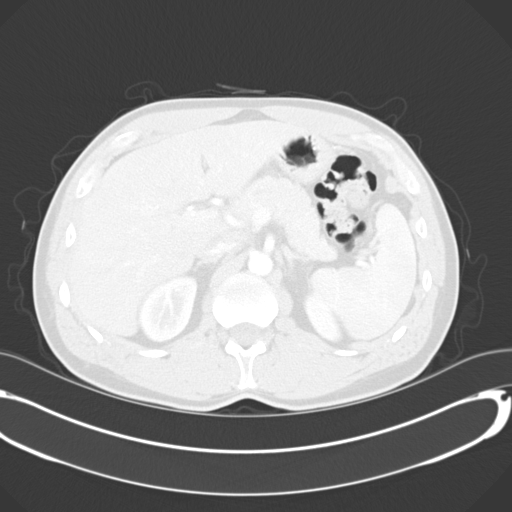
[im 11/69  lung]
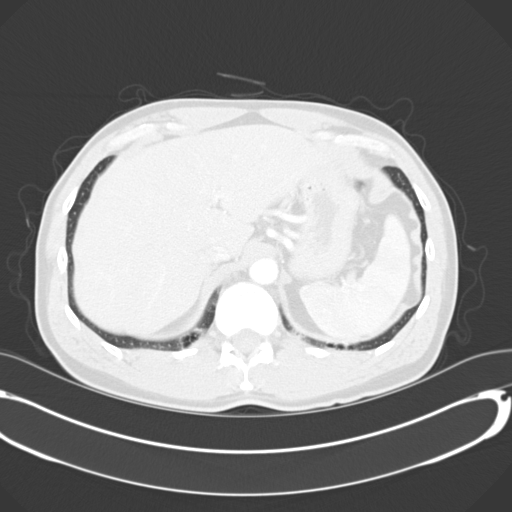
[im 16/69  lung]
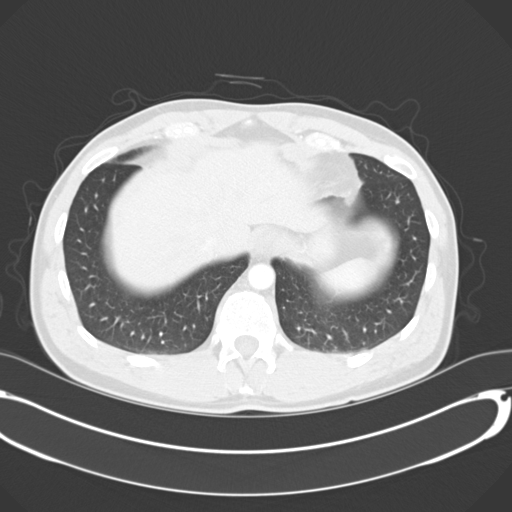
[im 21/69  lung]
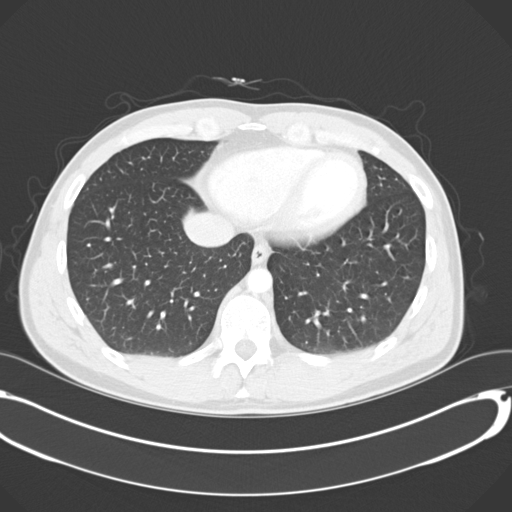
[im 27/69  mediastinal]
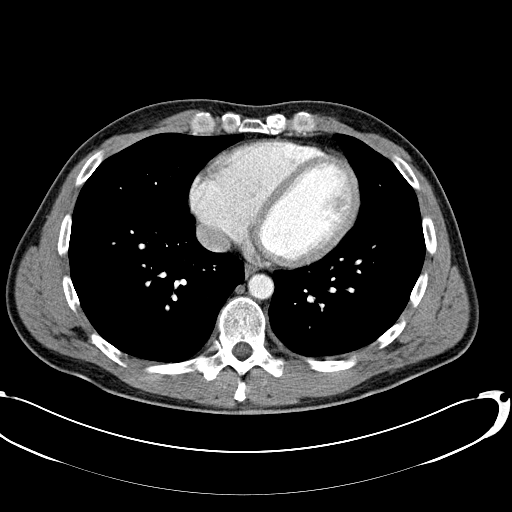
[im 27/69  lung]
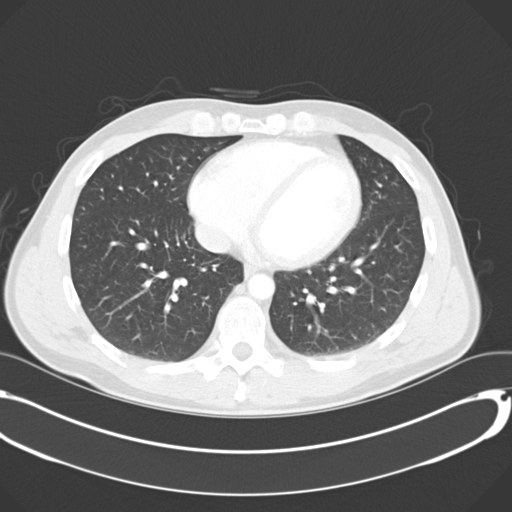
[im 32/69  lung]
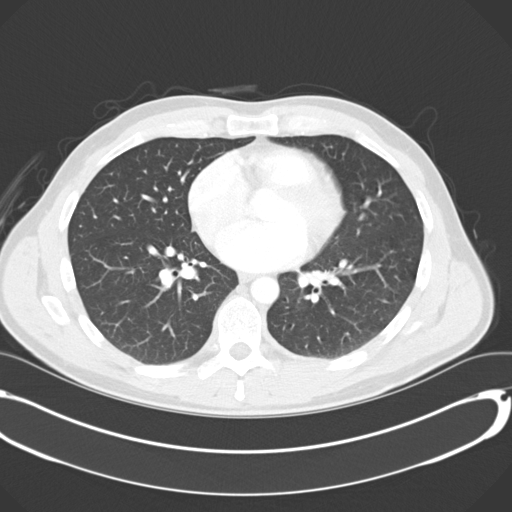
[im 37/69  lung]
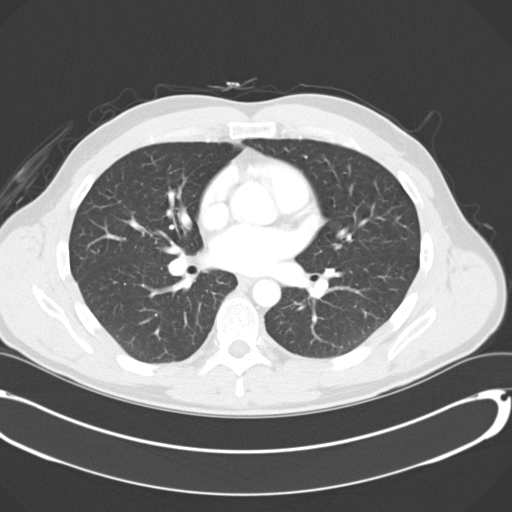
[im 42/69  lung]
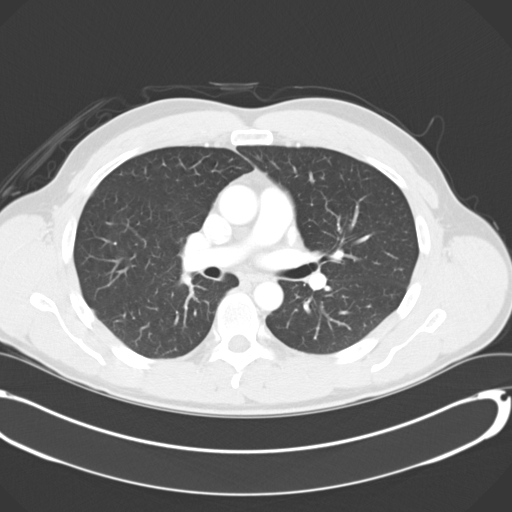
[im 48/69  mediastinal]
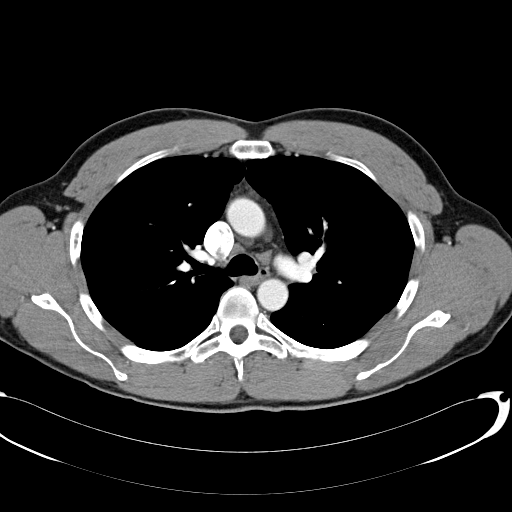
[im 48/69  lung]
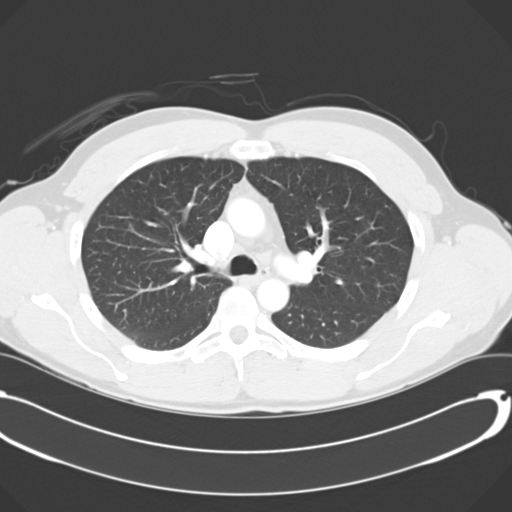
[im 53/69  lung]
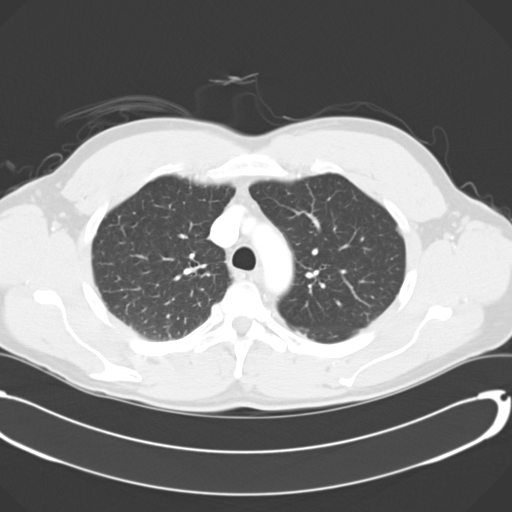
[im 58/69  lung]
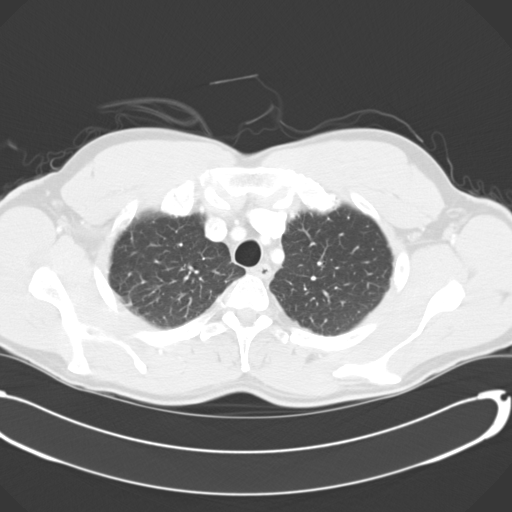
[im 63/69  lung]
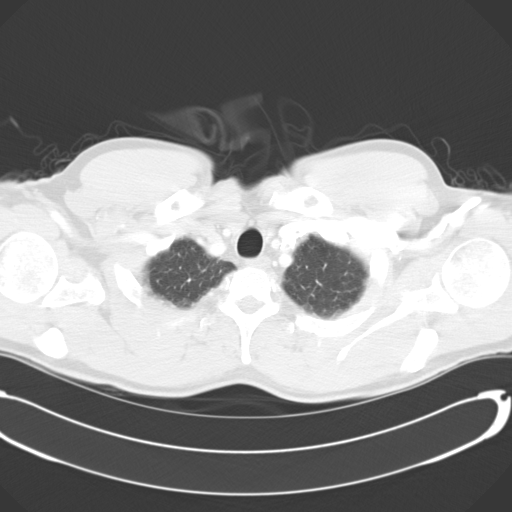

[Series 602: cor · coronal · 0.74mm/px · 3 of 110 slices shown]
[im 22/110  lung]
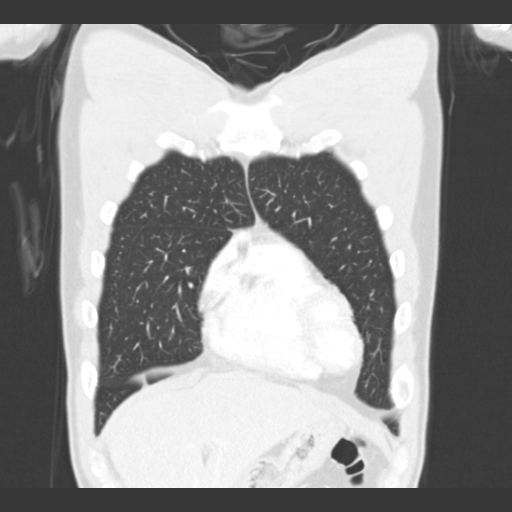
[im 44/110  lung]
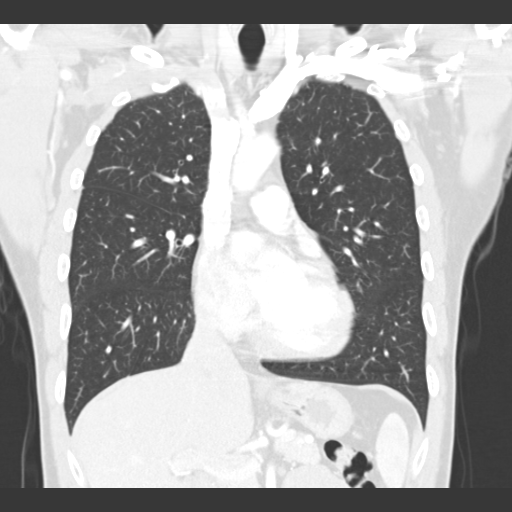
[im 66/110  lung]
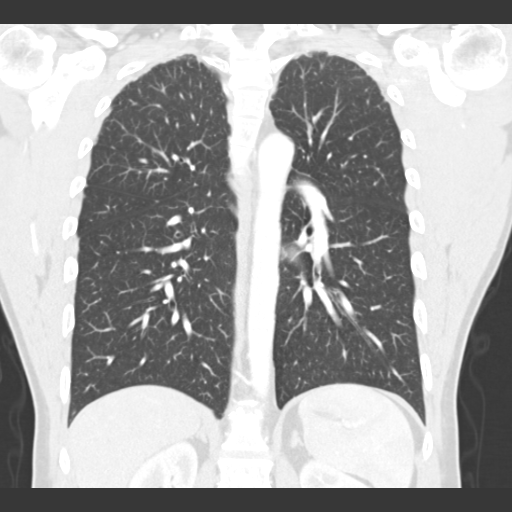

[15 of 36 positions shown; findings below may reference images not displayed]

FINDINGS: Mediastinal lymph nodes measure up to 10 mm in the low left
paratracheal station. No hilar or axillary adenopathy. Heart size
normal. No pericardial effusion.

Biapical pleural parenchymal scarring. Minimal dependent atelectasis
bilaterally. Lungs are otherwise clear. No pleural fluid. Airway is
unremarkable.

Incidental imaging of the upper abdomen shows the visualized
portions of the liver, adrenal glands, kidneys, spleen, pancreas,
stomach and bowel to be grossly unremarkable. No upper abdominal
adenopathy. No worrisome lytic or sclerotic lesions.
IMPRESSION: No acute findings. No findings to explain the patient's given
symptoms.

## 2014-08-22 ENCOUNTER — Encounter: Payer: Self-pay | Admitting: Family Medicine

## 2014-08-22 ENCOUNTER — Ambulatory Visit (INDEPENDENT_AMBULATORY_CARE_PROVIDER_SITE_OTHER): Payer: BC Managed Care – PPO | Admitting: Family Medicine

## 2014-08-22 VITALS — BP 100/66 | HR 61 | Temp 98.5°F | Ht 68.0 in | Wt 166.0 lb

## 2014-08-22 DIAGNOSIS — Z0000 Encounter for general adult medical examination without abnormal findings: Secondary | ICD-10-CM

## 2014-08-22 LAB — CBC WITH DIFFERENTIAL/PLATELET
BASOS PCT: 0.2 % (ref 0.0–3.0)
Basophils Absolute: 0 10*3/uL (ref 0.0–0.1)
EOS ABS: 0.1 10*3/uL (ref 0.0–0.7)
EOS PCT: 0.9 % (ref 0.0–5.0)
HEMATOCRIT: 46.7 % (ref 39.0–52.0)
Hemoglobin: 16 g/dL (ref 13.0–17.0)
LYMPHS ABS: 1.9 10*3/uL (ref 0.7–4.0)
Lymphocytes Relative: 23.2 % (ref 12.0–46.0)
MCHC: 34.4 g/dL (ref 30.0–36.0)
MCV: 93.1 fl (ref 78.0–100.0)
MONOS PCT: 6.4 % (ref 3.0–12.0)
Monocytes Absolute: 0.5 10*3/uL (ref 0.1–1.0)
Neutro Abs: 5.7 10*3/uL (ref 1.4–7.7)
Neutrophils Relative %: 69.3 % (ref 43.0–77.0)
Platelets: 225 10*3/uL (ref 150.0–400.0)
RBC: 5.01 Mil/uL (ref 4.22–5.81)
RDW: 12.3 % (ref 11.5–15.5)
WBC: 8.2 10*3/uL (ref 4.0–10.5)

## 2014-08-22 LAB — POCT URINALYSIS DIPSTICK
Bilirubin, UA: NEGATIVE
Blood, UA: NEGATIVE
Glucose, UA: NEGATIVE
KETONES UA: NEGATIVE
Leukocytes, UA: NEGATIVE
Nitrite, UA: NEGATIVE
PH UA: 7.5
PROTEIN UA: NEGATIVE
SPEC GRAV UA: 1.02
UROBILINOGEN UA: 0.2

## 2014-08-22 LAB — LIPID PANEL
Cholesterol: 155 mg/dL (ref 0–200)
HDL: 55 mg/dL (ref >39.00)
LDL Cholesterol: 82 mg/dL (ref 0–99)
NonHDL: 100
Total CHOL/HDL Ratio: 3
Triglycerides: 91 mg/dL (ref 0.0–149.0)
VLDL: 18.2 mg/dL (ref 0.0–40.0)

## 2014-08-22 LAB — BASIC METABOLIC PANEL
BUN: 11 mg/dL (ref 6–23)
CO2: 22 meq/L (ref 19–32)
CREATININE: 0.93 mg/dL (ref 0.40–1.50)
Calcium: 9.6 mg/dL (ref 8.4–10.5)
Chloride: 106 mEq/L (ref 96–112)
GFR: 93.35 mL/min (ref >60.00)
Glucose, Bld: 77 mg/dL (ref 70–99)
POTASSIUM: 4.3 meq/L (ref 3.5–5.1)
Sodium: 144 mEq/L (ref 135–145)

## 2014-08-22 LAB — HEPATIC FUNCTION PANEL
ALBUMIN: 4.4 g/dL (ref 3.5–5.2)
ALK PHOS: 62 U/L (ref 39–117)
ALT: 13 U/L (ref 0–53)
AST: 19 U/L (ref 0–37)
Bilirubin, Direct: 0.2 mg/dL (ref 0.0–0.3)
TOTAL PROTEIN: 6.9 g/dL (ref 6.0–8.3)
Total Bilirubin: 1 mg/dL (ref 0.2–1.2)

## 2014-08-22 MED ORDER — ALUMINUM CHLORIDE 20 % EX SOLN: Freq: Two times a day (BID) | CUTANEOUS | Status: DC

## 2014-08-22 NOTE — Progress Notes (Signed)
Pre visit review using our clinic review tool, if applicable. No additional management support is needed unless otherwise documented below in the visit note. 

## 2014-08-22 NOTE — Progress Notes (Signed)
   Subjective:    Patient ID: Trevor Hernandez, male    DOB: 1970-08-17, 44 y.o.   MRN: [id number]  HPI 44 yr old male for a cpx. He feels well but asks about excessive sweating un der the arm pits when he is in stressful situations, such as public speaking.    Review of Systems  Constitutional: Negative.   HENT: Negative.   Eyes: Negative.   Respiratory: Negative.   Cardiovascular: Negative.   Gastrointestinal: Negative.   Genitourinary: Negative.   Musculoskeletal: Negative.   Skin: Negative.   Neurological: Negative.   Psychiatric/Behavioral: Negative.        Objective:   Physical Exam  Constitutional: He is oriented to person, place, and time. He appears well-developed and well-nourished. No distress.  HENT:  Head: Normocephalic and atraumatic.  Right Ear: External ear normal.  Left Ear: External ear normal.  Nose: Nose normal.  Mouth/Throat: Oropharynx is clear and moist. No oropharyngeal exudate.  Eyes: Conjunctivae and EOM are normal. Pupils are equal, round, and reactive to light. Right eye exhibits no discharge. Left eye exhibits no discharge. No scleral icterus.  Neck: Neck supple. No JVD present. No tracheal deviation present. No thyromegaly present.  Cardiovascular: Normal rate, regular rhythm, normal heart sounds and intact distal pulses.  Exam reveals no gallop and no friction rub.   No murmur heard. Pulmonary/Chest: Effort normal and breath sounds normal. No respiratory distress. He has no wheezes. He has no rales. He exhibits no tenderness.  Abdominal: Soft. Bowel sounds are normal. He exhibits no distension and no mass. There is no tenderness. There is no rebound and no guarding.  Genitourinary: Rectum normal, prostate normal and penis normal. Guaiac negative stool. No penile tenderness.  Musculoskeletal: Normal range of motion. He exhibits no edema or tenderness.  Lymphadenopathy:    He has no cervical adenopathy.  Neurological: He is alert and oriented to  person, place, and time. He has normal reflexes. No cranial nerve deficit. He exhibits normal muscle tone. Coordination normal.  Skin: Skin is warm and dry. No rash noted. He is not diaphoretic. No erythema. No pallor.  Psychiatric: He has a normal mood and affect. His behavior is normal. Judgment and thought content normal.          Assessment & Plan:  Well exam. Get fasting labs. Try Drysol under the arms.

## 2014-08-23 LAB — TSH: TSH: 1.56 u[IU]/mL (ref 0.35–4.50)

## 2014-08-23 LAB — PSA: PSA: 0.36 ng/mL (ref 0.10–4.00)

## 2014-08-23 NOTE — Addendum Note (Signed)
Addended by: Donnita Falls on: 08/23/2014 08:31 AM   Modules accepted: Orders

## 2015-11-21 ENCOUNTER — Other Ambulatory Visit (INDEPENDENT_AMBULATORY_CARE_PROVIDER_SITE_OTHER): Payer: BC Managed Care – PPO

## 2015-11-21 DIAGNOSIS — Z0000 Encounter for general adult medical examination without abnormal findings: Secondary | ICD-10-CM | POA: Diagnosis not present

## 2015-11-21 LAB — POC URINALSYSI DIPSTICK (AUTOMATED)
Bilirubin, UA: NEGATIVE
Glucose, UA: NEGATIVE
Ketones, UA: NEGATIVE
LEUKOCYTES UA: NEGATIVE
NITRITE UA: NEGATIVE
Protein, UA: NEGATIVE
Spec Grav, UA: 1.02
Urobilinogen, UA: 0.2
pH, UA: 7

## 2015-11-21 LAB — CBC WITH DIFFERENTIAL/PLATELET
BASOS PCT: 0.4 % (ref 0.0–3.0)
Basophils Absolute: 0 10*3/uL (ref 0.0–0.1)
EOS ABS: 0.1 10*3/uL (ref 0.0–0.7)
Eosinophils Relative: 1.6 % (ref 0.0–5.0)
HCT: 45 % (ref 39.0–52.0)
Hemoglobin: 15.7 g/dL (ref 13.0–17.0)
LYMPHS ABS: 1.7 10*3/uL (ref 0.7–4.0)
Lymphocytes Relative: 21.5 % (ref 12.0–46.0)
MCHC: 34.8 g/dL (ref 30.0–36.0)
MCV: 91.6 fl (ref 78.0–100.0)
MONO ABS: 0.4 10*3/uL (ref 0.1–1.0)
Monocytes Relative: 5.5 % (ref 3.0–12.0)
NEUTROS ABS: 5.6 10*3/uL (ref 1.4–7.7)
NEUTROS PCT: 71 % (ref 43.0–77.0)
Platelets: 238 10*3/uL (ref 150.0–400.0)
RBC: 4.91 Mil/uL (ref 4.22–5.81)
RDW: 12.6 % (ref 11.5–15.5)
WBC: 7.8 10*3/uL (ref 4.0–10.5)

## 2015-11-21 LAB — LIPID PANEL
CHOLESTEROL: 175 mg/dL (ref 0–200)
HDL: 62.2 mg/dL (ref >39.00)
LDL CALC: 100 mg/dL — AB (ref 0–99)
NonHDL: 112.34
Total CHOL/HDL Ratio: 3
Triglycerides: 62 mg/dL (ref 0.0–149.0)
VLDL: 12.4 mg/dL (ref 0.0–40.0)

## 2015-11-21 LAB — BASIC METABOLIC PANEL
BUN: 13 mg/dL (ref 6–23)
CHLORIDE: 102 meq/L (ref 96–112)
CO2: 31 mEq/L (ref 19–32)
CREATININE: 0.93 mg/dL (ref 0.40–1.50)
Calcium: 9.5 mg/dL (ref 8.4–10.5)
GFR: 92.84 mL/min (ref >60.00)
Glucose, Bld: 91 mg/dL (ref 70–99)
Potassium: 4.6 mEq/L (ref 3.5–5.1)
Sodium: 139 mEq/L (ref 135–145)

## 2015-11-21 LAB — HEPATIC FUNCTION PANEL
ALT: 13 U/L (ref 0–53)
AST: 15 U/L (ref 0–37)
Albumin: 4.2 g/dL (ref 3.5–5.2)
Alkaline Phosphatase: 59 U/L (ref 39–117)
BILIRUBIN DIRECT: 0.2 mg/dL (ref 0.0–0.3)
BILIRUBIN TOTAL: 1.1 mg/dL (ref 0.2–1.2)
Total Protein: 6.8 g/dL (ref 6.0–8.3)

## 2015-11-21 LAB — TSH: TSH: 1.42 u[IU]/mL (ref 0.35–4.50)

## 2015-11-21 LAB — PSA: PSA: 0.43 ng/mL (ref 0.10–4.00)

## 2015-11-28 ENCOUNTER — Ambulatory Visit (INDEPENDENT_AMBULATORY_CARE_PROVIDER_SITE_OTHER): Payer: BC Managed Care – PPO | Admitting: Family Medicine

## 2015-11-28 ENCOUNTER — Encounter: Payer: Self-pay | Admitting: Family Medicine

## 2015-11-28 VITALS — BP 98/61 | HR 71 | Temp 98.7°F | Ht 68.0 in | Wt 165.0 lb

## 2015-11-28 DIAGNOSIS — Z0000 Encounter for general adult medical examination without abnormal findings: Secondary | ICD-10-CM | POA: Diagnosis not present

## 2015-11-28 MED ORDER — ALUMINUM CHLORIDE 20 % EX SOLN: Freq: Two times a day (BID) | CUTANEOUS | Status: DC

## 2015-11-28 NOTE — Progress Notes (Signed)
   Subjective:    Patient ID: Trevor Hernandez, male    DOB: 12/02/1970, 45 y.o.   MRN: [id number]  HPI 45 yr old male for a well exam. He feels well most of the time but he asks about spells where he thinks his blood glucose drops. He first started having these spells as a teenager. They only occur in the mid to late mornings. He can feel them coming on, and he describes feeling weak and shaky, he gets sweaty, and his thinking becomes a little foggy. He has learned that eating something like a granola bar or peanut butter will stop these spells. He usually eats breakfast but he seldom has a snack before lunch. He eats a wide variety of foods and he does yoga almost every day. He notes his father is a type 1 diabetic. Randy's fasting glucoses have always been normal.    Review of Systems  Constitutional: Negative.   HENT: Negative.   Eyes: Negative.   Respiratory: Negative.   Cardiovascular: Negative.   Gastrointestinal: Negative.   Genitourinary: Negative.   Musculoskeletal: Negative.   Skin: Negative.   Neurological: Negative.   Psychiatric/Behavioral: Negative.        Objective:   Physical Exam  Constitutional: He is oriented to person, place, and time. He appears well-developed and well-nourished. No distress.  HENT:  Head: Normocephalic and atraumatic.  Right Ear: External ear normal.  Left Ear: External ear normal.  Nose: Nose normal.  Mouth/Throat: Oropharynx is clear and moist. No oropharyngeal exudate.  Eyes: Conjunctivae and EOM are normal. Pupils are equal, round, and reactive to light. Right eye exhibits no discharge. Left eye exhibits no discharge. No scleral icterus.  Neck: Neck supple. No JVD present. No tracheal deviation present. No thyromegaly present.  Cardiovascular: Normal rate, regular rhythm, normal heart sounds and intact distal pulses.  Exam reveals no gallop and no friction rub.   No murmur heard. Pulmonary/Chest: Effort normal and breath sounds normal.  No respiratory distress. He has no wheezes. He has no rales. He exhibits no tenderness.  Abdominal: Soft. Bowel sounds are normal. He exhibits no distension and no mass. There is no tenderness. There is no rebound and no guarding.  Genitourinary: Rectum normal, prostate normal and penis normal. Guaiac negative stool. No penile tenderness.  Musculoskeletal: Normal range of motion. He exhibits no edema or tenderness.  Lymphadenopathy:    He has no cervical adenopathy.  Neurological: He is alert and oriented to person, place, and time. He has normal reflexes. No cranial nerve deficit. He exhibits normal muscle tone. Coordination normal.  Skin: Skin is warm and dry. No rash noted. He is not diaphoretic. No erythema. No pallor.  Psychiatric: He has a normal mood and affect. His behavior is normal. Judgment and thought content normal.          Assessment & Plan:  Well exam. We discussed diet and exercise. It does sound like he has classic hypoglycemic episodes, so I advised him to always eat a goof breakfast with a combination of complex carbs (like oatmeal) and protein. Then he should always have a mid morning snack with something like yogurt or trail mix. He will follow up prn. Laurey Morale, MD

## 2015-11-28 NOTE — Progress Notes (Signed)
Pre visit review using our clinic review tool, if applicable. No additional management support is needed unless otherwise documented below in the visit note. 

## 2016-08-06 ENCOUNTER — Telehealth: Payer: Self-pay | Admitting: Family Medicine

## 2016-08-06 DIAGNOSIS — N138 Other obstructive and reflux uropathy: Secondary | ICD-10-CM

## 2016-08-06 DIAGNOSIS — N401 Enlarged prostate with lower urinary tract symptoms: Principal | ICD-10-CM

## 2016-08-06 NOTE — Telephone Encounter (Signed)
I added the order for a PSA, but he needs to remind the lab of this on the day he comes in

## 2016-08-06 NOTE — Telephone Encounter (Signed)
Pt would like to have his PSA checked when he comes in for his CPE lab work in April.  May I have a order for that?

## 2016-08-07 NOTE — Telephone Encounter (Signed)
Pt is aware psa has been order and sch

## 2016-08-07 NOTE — Telephone Encounter (Signed)
Left message on voicemail to call office.  

## 2016-08-07 NOTE — Telephone Encounter (Signed)
Left a message for a return call.

## 2016-08-22 ENCOUNTER — Other Ambulatory Visit: Payer: BC Managed Care – PPO

## 2016-11-20 ENCOUNTER — Other Ambulatory Visit (INDEPENDENT_AMBULATORY_CARE_PROVIDER_SITE_OTHER): Payer: BC Managed Care – PPO

## 2016-11-20 DIAGNOSIS — Z0000 Encounter for general adult medical examination without abnormal findings: Secondary | ICD-10-CM

## 2016-11-20 DIAGNOSIS — N401 Enlarged prostate with lower urinary tract symptoms: Secondary | ICD-10-CM | POA: Diagnosis not present

## 2016-11-20 DIAGNOSIS — N138 Other obstructive and reflux uropathy: Secondary | ICD-10-CM

## 2016-11-20 LAB — CBC WITH DIFFERENTIAL/PLATELET
Basophils Absolute: 0 10*3/uL (ref 0.0–0.1)
Basophils Relative: 0.4 % (ref 0.0–3.0)
EOS ABS: 0.1 10*3/uL (ref 0.0–0.7)
Eosinophils Relative: 1.4 % (ref 0.0–5.0)
HCT: 45.1 % (ref 39.0–52.0)
HEMOGLOBIN: 15.8 g/dL (ref 13.0–17.0)
LYMPHS PCT: 25.3 % (ref 12.0–46.0)
Lymphs Abs: 1.7 10*3/uL (ref 0.7–4.0)
MCHC: 34.9 g/dL (ref 30.0–36.0)
MCV: 91.8 fl (ref 78.0–100.0)
Monocytes Absolute: 0.5 10*3/uL (ref 0.1–1.0)
Monocytes Relative: 8 % (ref 3.0–12.0)
Neutro Abs: 4.4 10*3/uL (ref 1.4–7.7)
Neutrophils Relative %: 64.9 % (ref 43.0–77.0)
Platelets: 242 10*3/uL (ref 150.0–400.0)
RBC: 4.92 Mil/uL (ref 4.22–5.81)
RDW: 12.5 % (ref 11.5–15.5)
WBC: 6.8 10*3/uL (ref 4.0–10.5)

## 2016-11-20 LAB — HEPATIC FUNCTION PANEL
ALT: 11 U/L (ref 0–53)
AST: 13 U/L (ref 0–37)
Albumin: 4.2 g/dL (ref 3.5–5.2)
Alkaline Phosphatase: 59 U/L (ref 39–117)
Bilirubin, Direct: 0.2 mg/dL (ref 0.0–0.3)
TOTAL PROTEIN: 6.6 g/dL (ref 6.0–8.3)
Total Bilirubin: 0.9 mg/dL (ref 0.2–1.2)

## 2016-11-20 LAB — LIPID PANEL
CHOL/HDL RATIO: 3
Cholesterol: 159 mg/dL (ref 0–200)
HDL: 63.1 mg/dL (ref >39.00)
LDL CALC: 86 mg/dL (ref 0–99)
NonHDL: 95.62
Triglycerides: 50 mg/dL (ref 0.0–149.0)
VLDL: 10 mg/dL (ref 0.0–40.0)

## 2016-11-20 LAB — BASIC METABOLIC PANEL
BUN: 14 mg/dL (ref 6–23)
CO2: 30 mEq/L (ref 19–32)
CREATININE: 0.93 mg/dL (ref 0.40–1.50)
Calcium: 9.2 mg/dL (ref 8.4–10.5)
Chloride: 102 mEq/L (ref 96–112)
GFR: 92.44 mL/min (ref >60.00)
Glucose, Bld: 85 mg/dL (ref 70–99)
POTASSIUM: 4.6 meq/L (ref 3.5–5.1)
Sodium: 138 mEq/L (ref 135–145)

## 2016-11-20 LAB — POC URINALSYSI DIPSTICK (AUTOMATED)
BILIRUBIN UA: NEGATIVE
Blood, UA: NEGATIVE
GLUCOSE UA: NEGATIVE
Ketones, UA: NEGATIVE
Leukocytes, UA: NEGATIVE
Nitrite, UA: NEGATIVE
Protein, UA: NEGATIVE
SPEC GRAV UA: 1.025 (ref 1.010–1.025)
Urobilinogen, UA: 0.2 E.U./dL
pH, UA: 6 (ref 5.0–8.0)

## 2016-11-20 LAB — TSH: TSH: 1.72 u[IU]/mL (ref 0.35–4.50)

## 2016-11-20 LAB — PSA: PSA: 0.46 ng/mL (ref 0.10–4.00)

## 2016-11-28 ENCOUNTER — Ambulatory Visit (INDEPENDENT_AMBULATORY_CARE_PROVIDER_SITE_OTHER): Payer: BC Managed Care – PPO | Admitting: Family Medicine

## 2016-11-28 ENCOUNTER — Encounter: Payer: Self-pay | Admitting: Family Medicine

## 2016-11-28 VITALS — BP 105/76 | HR 65 | Temp 98.6°F | Ht 68.0 in | Wt 161.0 lb

## 2016-11-28 DIAGNOSIS — Z0000 Encounter for general adult medical examination without abnormal findings: Secondary | ICD-10-CM | POA: Diagnosis not present

## 2016-11-28 MED ORDER — ALUMINUM CHLORIDE 20 % EX SOLN: Freq: Two times a day (BID) | CUTANEOUS | 5 refills | Status: DC

## 2016-11-28 NOTE — Patient Instructions (Signed)
WE NOW OFFER   [facility] FAST TRACK!!!  SAME DAY Appointments for ACUTE CARE  Such as: Sprains, Injuries, cuts, abrasions, rashes, muscle pain, joint pain, back pain Colds, flu, sore throats, headache, allergies, cough, fever  Ear pain, sinus and eye infections Abdominal pain, nausea, vomiting, diarrhea, upset stomach Animal/insect bites  3 Easy Ways to Schedule: Walk-In Scheduling Call in scheduling Mychart Sign-up: [email or web address]         

## 2016-11-28 NOTE — Progress Notes (Signed)
Pre visit review using our clinic review tool, if applicable. No additional management support is needed unless otherwise documented below in the visit note. 

## 2016-11-28 NOTE — Progress Notes (Signed)
   Subjective:    Patient ID: Trevor Hernandez, male    DOB: 01/18/1971, 46 y.o.   MRN: [id number]  HPI 46 yr old male for a well exam. He feels great and has no concerns. He enjoys yoga several days a week.    Review of Systems  Constitutional: Negative.   HENT: Negative.   Eyes: Negative.   Respiratory: Negative.   Cardiovascular: Negative.   Gastrointestinal: Negative.   Genitourinary: Negative.   Musculoskeletal: Negative.   Skin: Negative.   Neurological: Negative.   Psychiatric/Behavioral: Negative.        Objective:   Physical Exam  Constitutional: He is oriented to person, place, and time. He appears well-developed and well-nourished. No distress.  HENT:  Head: Normocephalic and atraumatic.  Right Ear: External ear normal.  Left Ear: External ear normal.  Nose: Nose normal.  Mouth/Throat: Oropharynx is clear and moist. No oropharyngeal exudate.  Eyes: Conjunctivae and EOM are normal. Pupils are equal, round, and reactive to light. Right eye exhibits no discharge. Left eye exhibits no discharge. No scleral icterus.  Neck: Neck supple. No JVD present. No tracheal deviation present. No thyromegaly present.  Cardiovascular: Normal rate, regular rhythm, normal heart sounds and intact distal pulses.  Exam reveals no gallop and no friction rub.   No murmur heard. Pulmonary/Chest: Effort normal and breath sounds normal. No respiratory distress. He has no wheezes. He has no rales. He exhibits no tenderness.  Abdominal: Soft. Bowel sounds are normal. He exhibits no distension and no mass. There is no tenderness. There is no rebound and no guarding.  Genitourinary: Rectum normal, prostate normal and penis normal. Rectal exam shows guaiac negative stool. No penile tenderness.  Musculoskeletal: Normal range of motion. He exhibits no edema or tenderness.  Lymphadenopathy:    He has no cervical adenopathy.  Neurological: He is alert and oriented to person, place, and time. He has  normal reflexes. No cranial nerve deficit. He exhibits normal muscle tone. Coordination normal.  Skin: Skin is warm and dry. No rash noted. He is not diaphoretic. No erythema. No pallor.  Psychiatric: He has a normal mood and affect. His behavior is normal. Judgment and thought content normal.          Assessment & Plan:  Well exam. We discussed diet and exercise.  Alysia Penna, MD

## 2017-04-23 ENCOUNTER — Encounter: Payer: Self-pay | Admitting: Family Medicine

## 2017-04-24 ENCOUNTER — Encounter: Payer: Self-pay | Admitting: Family Medicine

## 2017-04-24 ENCOUNTER — Ambulatory Visit (INDEPENDENT_AMBULATORY_CARE_PROVIDER_SITE_OTHER): Payer: BC Managed Care – PPO | Admitting: Family Medicine

## 2017-04-24 VITALS — BP 115/88 | HR 66 | Temp 98.2°F | Ht 68.0 in | Wt 156.0 lb

## 2017-04-24 DIAGNOSIS — Z23 Encounter for immunization: Secondary | ICD-10-CM

## 2017-04-24 DIAGNOSIS — E162 Hypoglycemia, unspecified: Secondary | ICD-10-CM | POA: Diagnosis not present

## 2017-04-24 NOTE — Progress Notes (Signed)
   Subjective:    Patient ID: Trevor Hernandez, male    DOB: [birth year], 46 y.o.   MRN: [id number]  HPI Here for advice about labile blood sugars. He says that he has had frequent episodes through most of his life where he feels shaky and weak, he becomes disoriented, and he has trouble speaking. He can make these episodes stop by eating sugary foods. He has learned to avoid concentrated carbohydrates and he eats more proteins than anything else. He says his father developed "type 1.5 diabetes" while in his 93s, and he now produces none of his own insulin and he takes insulin..   Review of Systems  Respiratory: Negative.   Cardiovascular: Negative.   Neurological: Positive for speech difficulty, weakness, light-headedness and headaches. Negative for dizziness, tremors, seizures, syncope and facial asymmetry.       Objective:   Physical Exam  Constitutional: He is oriented to person, place, and time. He appears well-developed and well-nourished.  Cardiovascular: Normal rate, regular rhythm, normal heart sounds and intact distal pulses.   Pulmonary/Chest: Effort normal and breath sounds normal. No respiratory distress. He has no wheezes. He has no rales.  Neurological: He is alert and oriented to person, place, and time.  Psychiatric: He has a normal mood and affect. His behavior is normal. Thought content normal.          Assessment & Plan:  Possible hypogylcemia with a family hx of autoimmune diabetes. We will refer hi,m to Endocrine to evaluate.  Alysia Penna, MD

## 2017-04-24 NOTE — Patient Instructions (Signed)
WE NOW OFFER   [facility] FAST TRACK!!!  SAME DAY Appointments for ACUTE CARE  Such as: Sprains, Injuries, cuts, abrasions, rashes, muscle pain, joint pain, back pain Colds, flu, sore throats, headache, allergies, cough, fever  Ear pain, sinus and eye infections Abdominal pain, nausea, vomiting, diarrhea, upset stomach Animal/insect bites  3 Easy Ways to Schedule: Walk-In Scheduling Call in scheduling Mychart Sign-up: [email or web address]         

## 2017-05-29 ENCOUNTER — Telehealth: Payer: Self-pay | Admitting: Family Medicine

## 2017-05-29 NOTE — Telephone Encounter (Signed)
No I am not aware of any other product like this. Perhaps his pharmacist would have an idea

## 2017-05-29 NOTE — Telephone Encounter (Signed)
° ° ° °  Pt said the below med is no longer available and is asking if Dr Sarajane Jews know of any other med    aluminum chloride (DRYSOL) 20 % external solution     [facility]  at [place]

## 2017-05-29 NOTE — Telephone Encounter (Signed)
I tried to reach pt by phone, no answer. I sent pt a my chart message with this information.

## 2017-06-01 NOTE — Telephone Encounter (Signed)
Please in Drysol 20 % solution, 37.5 ml (without the "Dabomatic") #1 with 11 rf. Apply daily

## 2017-06-02 MED ORDER — ALUMINUM CHLORIDE 20 % EX SOLN: Freq: Two times a day (BID) | CUTANEOUS | 11 refills | Status: DC

## 2017-06-02 NOTE — Telephone Encounter (Signed)
Refill sent.

## 2017-07-17 ENCOUNTER — Ambulatory Visit: Payer: BC Managed Care – PPO | Admitting: Internal Medicine

## 2017-07-17 ENCOUNTER — Encounter: Payer: Self-pay | Admitting: Internal Medicine

## 2017-07-17 VITALS — BP 130/82 | HR 77 | Ht 69.0 in | Wt 155.4 lb

## 2017-07-17 DIAGNOSIS — E162 Hypoglycemia, unspecified: Secondary | ICD-10-CM | POA: Diagnosis not present

## 2017-07-17 LAB — GLUCOSE, POCT (MANUAL RESULT ENTRY): POC Glucose: 107 mg/dL — AB (ref 70–99)

## 2017-07-17 LAB — POCT GLYCOSYLATED HEMOGLOBIN (HGB A1C): HEMOGLOBIN A1C: 5

## 2017-07-17 NOTE — Patient Instructions (Addendum)
Please review the following website - stay with the lower glycemic load foods: Www.glycemicindex.com  Have 3 meals a day + 2 low caloriesnacks.  Eat as much fruit and veggies as you can. Berries, pears, apples, peaches, apricots - are the best.  Do not drink liquids with a meal, separate them by at least 30 min.   Start the meal with protein and fat and end with carbs.  No sweet drinks.  Carry a snack with you everywhere. Best - to contain ~15 g of carbs.  I will refer you to nutrition.  Please check sugars whenever you feel poorly.  If sugars are <50, please go to the nearest lab to get further tests.   Please schedule a return appt in 4 months.

## 2017-07-17 NOTE — Progress Notes (Signed)
Patient ID: Trevor Hernandez, male   DOB: [birth year], 46 y.o.   MRN: [id number]   HPI: Trevor Hernandez is a 46 y.o.-year-old male, referred by Dr. Sarajane Jews, for management of hypoglycemia.  She has had hypoglycemic sxs for most of his life, since he was a teenager: - + tremors - + dizziness - + fatigue - When he was young >> he would go to sleep >> resolved.  As he got older >> episode more frequent, daily, but less frequent after changed his diet >> high protein, low carb - during the summer: - + tremors - + dizziness - + fatigue - + sweating - + incapacity to concentrate - + sensation of passing out - he did pass out once (he was sick at that time, 10 years ago, called 911 - unclear if low CBG). Since then he feels this coming >> prevents it but may have 2 episodes a year. - no palpitations - no HA - no anxiety  He had pbs when driving 10 years ago, not recently.  No hecking sugars at home. Never documented a low CBG.  Lab Results  Component Value Date   GLUCOSE 85 11/20/2016   GLUCOSE 91 11/21/2015   GLUCOSE 77 08/22/2014   GLUCOSE 80 04/13/2013   GLUCOSE 90 02/12/2012   GLUCOSE 90 04/01/2011    No previous HbA1c levels are available for review. Today, HbA1c was 5.0%.  + fasting hypoglycemia only if he skips dinner and b'fast.. He tries to eat many times a day to keep up with this.   No signif. Weight gain or loss.  He is not on any meds with hypoglycemia side effects.  Pt's meals are:  - Breakfast: eggs + whole grain bread - snack: nuts - Lunch: PB + whole wheat bread - snack: apple, cheese, tuna - Dinner: fish or salads with beans, seeds - Snack: no concentrated sweets; apple, banana, beer  She has not seen nutrition.  - no CKD, last BUN/creatinine:  Lab Results  Component Value Date   BUN 14 11/20/2016   CREATININE 0.93 11/20/2016   Of note, he had a chest CT in 2015 that showed normal pancreas and adrenal glands.    His father was diagnosed with type 1.5  diabetes in his 44s after a viral infection.  ROS: + See HPI Constitutional: + Weight loss, + fatigue, no subjective hyperthermia/hypothermia Eyes: no blurry vision, no xerophthalmia ENT: no sore throat, no nodules palpated in throat, no dysphagia/odynophagia, no hoarseness Cardiovascular: no CP/SOB/palpitations/leg swelling Respiratory: no cough/SOB Gastrointestinal: no N/V/D/C Musculoskeletal: no muscle/joint aches Skin: no rashes Neurological: + Tremors/no numbness/tingling/+ dizziness Psychiatric: no depression/anxiety  Past Medical History:  Diagnosis Date  . Epididymitis    sees Dr. Rolan Bucco    No past surgical history on file. Social History   Socioeconomic History  . Marital status: Married    Spouse name: Not on file  . Number of children: 2  Occupational History  .  [employer]  Tobacco Use  . Smoking status: Never Smoker  . Smokeless tobacco: Never Used  Substance and Sexual Activity  . Alcohol use: Yes    Alcohol/week: 4.2 oz    Types: 7 Standard drinks or equivalent per week    Comment: glass of wine or beer in the evening  . Drug use: No   Current Outpatient Medications on File Prior to Visit  Medication Sig Dispense Refill  . aluminum chloride (DRYSOL) 20 % external solution Apply topically 2 (two) times daily.  Without dabomatic 37.5 mL 11   No current facility-administered medications on file prior to visit.    Allergies  Allergen Reactions  . Penicillins     Pt's wife is highly sensitive, so he would not want to take this.   Family History  Problem Relation Age of Onset  . Diabetes Father     PE: BP 130/82   Pulse 77   Ht 5\' 9"  (1.753 m)   Wt 155 lb 6.4 oz (70.5 kg)   SpO2 98%   BMI 22.95 kg/m  Wt Readings from Last 3 Encounters:  07/17/17 155 lb 6.4 oz (70.5 kg)  04/24/17 156 lb (70.8 kg)  11/28/16 161 lb (73 kg)   Constitutional: normal weight, in NAD Eyes: PERRLA, EOMI, no exophthalmos ENT: moist mucous  membranes, no thyromegaly, no cervical lymphadenopathy Cardiovascular: RRR, No MRG Respiratory: CTA B Gastrointestinal: abdomen soft, NT, ND, BS+ Musculoskeletal: no deformities, strength intact in all 4 Skin: moist, warm, no rashes Neurological: no tremor with outstretched hands, DTR normal in all 4  ASSESSMENT: 1. Symptoms of Hypoglycemia - unclear if actually has hypoglycemia during the episodes as he never checked  PLAN:  1. Patient with history of hypoglycemic sxs, with ? CBG levels during the episodes. The glucose levels on file form him are normal, per review of his chart.  He is quite symptomatic with the episodes, with a sensation of dizziness, and capacity to concentrate also tremors and sensation of passing out.  He did pass out once 10 years ago.  His symptoms are much better after he started to change his diet to a high-protein, low-carb one this past summer.  His episodes on are now less frequent, however, he did have an episode this morning after his skip dinner and breakfast.  This is the second episode that happens after he skips to meals in a row.  The episodes are relieved by eating carbs, and he tells me that he takes approximately 15 minutes to recover after them.   - I had a long discussion with the patient about what reactive hypoglycemia means, and the fact that in patients without diabetes, the CBG threshold of 70 does not apply. Therefore, a sugar in the 60s is perfectly normal, and it can be even lower than that especially in young women and athletes.  - We discussed about what the notion of "sugar crash" means and that it can be a normal reaction to a high glycemic index food.  - In pts with insulin resistance, for example prediabetic pts, there can be a mismatch between the sugar increase after a meal and pancreatic insulin production. Insulin is secreted in the circulation to late and in too large quantities >> hypoglycemia.  In his case, his HbA1c today was 5.0%, so I  reassured him that he does not have prediabetes or diabetes.  He also lost 10-15 pounds after he changed his diet this summer and I suspect that his insulin resistance has improved significantly. - However, in this case, I suspect that he is more sensitive to glycemic excursions rather than having significant hypoglycemia.  Of course, we will need blood sugars associated with the episodes to make sure, and at this visit, we showed him how to use a glucometer (glucose in the office was 107) and strongly advised him to start checking at home whenever he feels his sugars may be low - we discussed at length about diet (including examples) and the importance of avoiding high Glycemic Index foods -  given web link for reference.  - I advised her to continue to work with nutrition to improve his diet to avoid reactive hypoglycemia - I also advised him to go to the nearest lab (have somebody drive her) if sugars are 50 or below and I gave him lab orders to be done if sugars are low: Orders Placed This Encounter  Procedures  . C-peptide  . BASIC METABOLIC PANEL WITH GFR  . Proinsulin/insulin ratio  . Beta-hydroxybutyric acid  . Insulin, random  . Amb ref to Medical Nutrition Therapy-MNT  . POCT glycosylated hemoglobin (Hb A1C)  . POCT glucose (manual entry)  - given instructions for hypoglycemia management "15-15 rule"  - will schedule him to come back in 4 mo >> if not better >> may need a low dose Acarbose   Patient Instructions  Please review the following website - stay with the lower glycemic load foods: Www.glycemicindex.com  Have 3 meals a day + 2 low caloriesnacks.  Eat as much fruit and veggies as you can. Berries, pears, apples, peaches, apricots - are the best.  Do not drink liquids with a meal, separate them by at least 30 min.   Start the meal with protein and fat and end with carbs.  No sweet drinks.  Carry a snack with you everywhere. Best - to contain ~15 g of carbs.  I will  refer you to nutrition.  Please check sugars whenever you feel poorly.  If sugars are <50, please go to the nearest lab to get further tests.   Please schedule a return appt in 4 months.   Philemon Kingdom, MD PhD [facility]

## 2017-08-17 ENCOUNTER — Ambulatory Visit: Payer: BC Managed Care – PPO | Admitting: Registered"

## 2017-08-24 ENCOUNTER — Encounter: Payer: BC Managed Care – PPO | Attending: Internal Medicine | Admitting: Dietician

## 2017-08-24 ENCOUNTER — Encounter: Payer: Self-pay | Admitting: Dietician

## 2017-08-24 DIAGNOSIS — Z713 Dietary counseling and surveillance: Secondary | ICD-10-CM | POA: Insufficient documentation

## 2017-08-24 DIAGNOSIS — E162 Hypoglycemia, unspecified: Secondary | ICD-10-CM | POA: Insufficient documentation

## 2017-08-24 NOTE — Progress Notes (Signed)
  Medical Nutrition Therapy:  Appt start time: 1400 end time:  0093.   Assessment:  Primary concerns today: Patient is here today alone.  He has been having problems with hypoglycemia since he was a teen.  He modified his diet this summer ([place]) and has felt better but is now looking at moving away from animal products and would like assistance with this as well. Weight 155 lbs stable recently.  Lost 10 lbs this summer with diet change.  He is married and works as a Scientist, physiological at [employer].   Preferred Learning Style:   No preference indicated   Learning Readiness:   Ready  Change in progress   MEDICATIONS: none   DIETARY INTAKE: Vegetarian for 8 years.  Wife has history of heart disease and wants to be healthy and support his wife. He does eat some fish.   Usual eating pattern includes 3 meals and 2 snacks per day.  24-hr recall:  B ( AM): coffee with low fat milk, oatmeal, walnuts OR 2 eggs, whole grain bread, occasional cheese  Snk ( AM): nuts  L ( PM): PB sandwich on whole grain bread, apple, sometimes cheese Snk ( PM): sugar free trail mix OR apple D ( PM): beans or fish or salad with beans/seeds or vegeburger on whole grain bread OR vegetarian tacos OR occasional pasta OR baked potato, beans, cheese Snk ( PM): occasional beer or wine Beverages: water, coffee with LF milk (1 cup each morning)  Usual physical activity: works out 5 days per week (power yoga)   Progress Towards Goal(s):  In progress.   Nutritional Diagnosis:  NB-1.1 Food and nutrition-related knowledge deficit As related to hypoglycemia and plant based eating.  As evidenced by patient report.    Intervention:  Nutrition education related to hypoglycemia.  Discussed reactive hypoglycemia vs lows from going too long between eating.  Discussed glycemic index and blood sugar response to food combinations as well as benefits of whole grains, fiber, protein.  Discussed meal planning and plant based  eating.  Breakfast ideas:  Oatmeal with nuts, LF milk, berries or apple  Scrambled tofu (onions, peppers, tomatoes, extra firm tofu, tumeric, seasonings) in [employer] tortilla or [employer] toast, fruit  [employer] pancakes, berries, scrambled tofu OR with PB and unsweetened applesauce.(OR Kodiac mix)  Birdseye protein blend and an egg or scrambled tofu   Lunch ideas:  [employer] pita bread, hummus, tabouli  Salad with protein, whole grain cracker, fruit  Soup (with legumes), whole grain crackers, fruit  Leftovers  Pair carbohydrates with a protein. Be sure to have carbohydrates with each meal. Whole grains rather than processed white flour Small snacks between meals.  Pcrm.org Forks over [employer] based athlete Engine2diet.com 3abn.org or 3abnrecipes.org  Heather's quinoa (101 cookbooks) Vegan blogs:  Tasty vegan  Vegan richa  The stingy vegan  Tasty vegetarian  She likes food  Vegan itit vegan  Nutritionicity.com  Meat alternatives:  Gimmie Lean  Gardein  [employer]  [employer]   Teaching Method Utilized:  Ship broker Hands on  Handouts given during visit include:  Plant based primer  Vegetarian tips  Barriers to learning/adherence to lifestyle change: none  Demonstrated degree of understanding via:  Teach Back   Monitoring/Evaluation:  Dietary intake, exercise, and body weight prn.

## 2017-08-24 NOTE — Patient Instructions (Signed)
Breakfast ideas:  Oatmeal with nuts, LF milk, berries or apple  Scrambled tofu (onions, peppers, tomatoes, extra firm tofu, tumeric, seasonings) in [employer] tortilla or [employer] toast, fruit  [employer] pancakes, berries, scrambled tofu OR with PB and unsweetened applesauce.(OR Kodiac mix)  Birdseye protein blend and an egg or scrambled tofu   Lunch ideas:  [employer] pita bread, hummus, tabouli  Salad with protein, whole grain cracker, fruit  Soup (with legumes), whole grain crackers, fruit  Leftovers  Pair carbohydrates with a protein. Be sure to have carbohydrates with each meal. Whole grains rather than processed white flour Small snacks between meals.  Pcrm.org Forks over [employer] based athlete Engine2diet.com 3abn.org or 3abnrecipes.org  Heather's quinoa (101 cookbooks) Vegan blogs:  Tasty vegan  Vegan richa  The stingy vegan  Tasty vegetarian  She likes food  Vegan itit vegan  Nutritionicity.com  Meat alternatives:  [place]

## 2017-11-16 ENCOUNTER — Encounter: Payer: Self-pay | Admitting: Internal Medicine

## 2017-11-16 ENCOUNTER — Ambulatory Visit: Payer: BC Managed Care – PPO | Admitting: Internal Medicine

## 2017-11-16 VITALS — BP 114/78 | HR 78 | Ht 68.0 in | Wt 158.6 lb

## 2017-11-16 DIAGNOSIS — E162 Hypoglycemia, unspecified: Secondary | ICD-10-CM

## 2017-11-16 NOTE — Progress Notes (Signed)
Patient ID: Trevor Hernandez, male   DOB: Oct 18, 1970, 47 y.o.   MRN: [id number]   HPI: Trevor Hernandez is a 47 y.o.-year-old male, initially referred by Dr. Sarajane Jews, returning for follow-up for Nt of hypoglycemia.  Last visit 4 months ago.  He only had 2 hypoglycemic episodes since last visit: 1. He had 1 hypoglycemic episode when out of town in [place] (ate white pizza crust 3 hours prior to the episode) >> could not test >> got OJ >> corrected 2. He had another episode while at home after sugary cereal: got hungry >> eat sweets >> by the time he tested, his CBG was 70.  Reviewed and addended history: Patient has had hypoglycemic symptoms for most of his life, since he was a teenager.  When he was young, the episodes resolved by going to sleep.    The episodes are associated with: -Tremors -Dizziness -Fatigue  As he got older, the episodes were more frequent, occurring daily, but less frequent after he changed to a high protein low-carb diet last summer.  He still developed: -Tremors -Dizziness -Fatigue -Sweating -Incapacity to concentrate -Sensation of passing out- he did pass out once (he was sick at that time, 10 years ago, called 911 - unclear if low CBG). Since then he feels this coming >> prevents it but may have 2 episodes a year.  But denies: -Headaches -Palpitations -Anxiety  He had problems from hypoglycemia when driving but more than 10 years ago, not recently.  No significant weight gain or weight loss.  He is not on any medications with hypoglycemic side effects.  Review documented glucose levels: Lab Results  Component Value Date   GLUCOSE 85 11/20/2016   GLUCOSE 91 11/21/2015   GLUCOSE 77 08/22/2014   GLUCOSE 80 04/13/2013   GLUCOSE 90 02/12/2012   GLUCOSE 90 04/01/2011    Previous HbA1c levels were all normal: Lab Results  Component Value Date   HGBA1C 5.0 07/17/2017   At last visit, we discussed about improving the glycemic index of his meals, to  separate liquids from meals by at least 30 minutes, to eat protein and fat before eating anything sweet, to not skip meals and even include snacks between meals, to carry a snack with him everywhere, to not drink sweet drinks.  I also referred him to nutrition.  I also gave him a list of tests to get in case his sugars drop under 50  (Including beta hydroxybutyrate, insulin, proinsulin, C-peptide, glucose).  He did not have an incidence of sugars lower than 50, so he did not get the above tests checked.  She has seen nutrition since last visit.  He started to check sugars since last visit: 100-110 - when feeling normal.   Patient changed his diet before our last visit, to a lower carb one: - Breakfast: eggs + whole grain bread - snack: nuts - Lunch: PB + whole wheat bread - snack: apple, cheese, tuna - Dinner: fish or salads with beans, seeds - Snack: no concentrated sweets; apple, banana, beer Since last visit, he improved this even more reducing concentrated carbs and a lot of starches.  -No CKD, last BUN/creatinine:  Lab Results  Component Value Date   BUN 14 11/20/2016   CREATININE 0.93 11/20/2016   He had a chest CT in 2015 that showed a normal pancreas and adrenal glands.  His father was diagnosed with type 1.5 diabetes in his 75s after a viral infection.  ROS: + See HPI Constitutional: no weight gain/no weight  loss, no fatigue, no subjective hyperthermia, no subjective hypothermia Eyes: no blurry vision, no xerophthalmia ENT: no sore throat, no nodules palpated in throat, no dysphagia, no odynophagia, no hoarseness Cardiovascular: no CP/no SOB/no palpitations/no leg swelling Respiratory: no cough/no SOB/no wheezing Gastrointestinal: no N/no V/no D/no C/no acid reflux Musculoskeletal: no muscle aches/no joint aches Skin: no rashes, no hair loss Neurological: no tremors/no numbness/no tingling/no dizziness  I reviewed pt's medications, allergies, PMH, social hx, family hx,  and changes were documented in the history of present illness. Otherwise, unchanged from my initial visit note.  Past Medical History:  Diagnosis Date  . Epididymitis    sees Dr. Rolan Bucco    No past surgical history on file. Social History   Socioeconomic History  . Marital status: Married    Spouse name: Not on file  . Number of children: 2  Occupational History  .  [employer]  Tobacco Use  . Smoking status: Never Smoker  . Smokeless tobacco: Never Used  Substance and Sexual Activity  . Alcohol use: Yes    Alcohol/week: 4.2 oz    Types: 7 Standard drinks or equivalent per week    Comment: glass of wine or beer in the evening  . Drug use: No   Current Outpatient Medications on File Prior to Visit  Medication Sig Dispense Refill  . aluminum chloride (DRYSOL) 20 % external solution Apply topically 2 (two) times daily. Without dabomatic (Patient not taking: Reported on 08/24/2017) 37.5 mL 11   No current facility-administered medications on file prior to visit.    Allergies  Allergen Reactions  . Penicillins     Pt's wife is highly sensitive, so he would not want to take this.   Family History  Problem Relation Age of Onset  . Diabetes Father     PE: BP 114/78   Pulse 78   Ht 5\' 8"  (1.727 m)   Wt 158 lb 9.6 oz (71.9 kg)   SpO2 96%   BMI 24.12 kg/m  Wt Readings from Last 3 Encounters:  11/16/17 158 lb 9.6 oz (71.9 kg)  08/24/17 155 lb (70.3 kg)  07/17/17 155 lb 6.4 oz (70.5 kg)   Constitutional: Normal weight, in NAD Eyes: PERRLA, EOMI, no exophthalmos ENT: moist mucous membranes, no thyromegaly, no cervical lymphadenopathy Cardiovascular: RRR, No MRG Respiratory: CTA B Gastrointestinal: abdomen soft, NT, ND, BS+ Musculoskeletal: no deformities, strength intact in all 4 Skin: moist, warm, no rashes Neurological: no tremor with outstretched hands, DTR normal in all 4  ASSESSMENT: 1. Symptoms of Hypoglycemia - unclear if actually has  hypoglycemia during the episodes as he never checked latest HbA1c 5.0%  Latest HbA1c: 5.0%, at last visit  PLAN:  1. Patient with history of hypoglycemic symptoms with questionable CBG levels during the episodes.  Since last visit, however, he obtained a CBG meter and the lowest sugars checked at home or in the 70s.  Of note, some of these were during correction of a perceived low. - At last visit, we discussed at length about  changing his diet to avoid further hypoglycemic episodes.  I made several suggestions, which we reinforced today: Eat low glycemic index/load foods Have 3 meals a day + 2 low caloriesnacks. Eat as much fruit and veggies as you can. Berries, pears, apples, peaches, apricots - are the best. Do not drink liquids with the meal, separate them by at least 30 minutes  Start the meal with protein and fat and and with carbs No suite  drinks Carry a snack with you everywhere.  Best to contain approximately 15 g of carbs Check sugars whenever you feel poorly If sugars are lower than 50, please go to the nearest lab to get further tests - Since last visit, he saw nutrition, also for further dietary advice  - he did very well applying the above changes and actually his episodes are very rare now, only 2 since last visit, and he came trace both episodes to higher glycemic index foods - We discussed at last visit about the potential addition of acarbose, but I do not feel this is necessary, since he is doing a great job with the diet; we did discuss about the mechanism of action and possible side effects for acarbose and I advised him to let me know if he develops his hypoglycemic episodes again, in that case, we can still use this. - I will see him back as needed  - time spent with the patient: 25 minutes, of which >50% was spent in obtaining information about his symptoms, reviewing his previous labs, evaluations, and treatments, counseling him about his condition (please see the  discussed topics above), and developing a plan to further investigate it; he had a number of questions which I addressed.  Philemon Kingdom, MD PhD [facility]

## 2017-11-16 NOTE — Patient Instructions (Addendum)
Please return to see me as needed. °

## 2018-01-07 ENCOUNTER — Ambulatory Visit (INDEPENDENT_AMBULATORY_CARE_PROVIDER_SITE_OTHER): Payer: BC Managed Care – PPO | Admitting: Family Medicine

## 2018-01-07 ENCOUNTER — Encounter: Payer: Self-pay | Admitting: Family Medicine

## 2018-01-07 VITALS — BP 92/60 | HR 72 | Temp 98.2°F | Ht 68.0 in | Wt 157.6 lb

## 2018-01-07 DIAGNOSIS — N138 Other obstructive and reflux uropathy: Secondary | ICD-10-CM | POA: Diagnosis not present

## 2018-01-07 DIAGNOSIS — Z0000 Encounter for general adult medical examination without abnormal findings: Secondary | ICD-10-CM | POA: Diagnosis not present

## 2018-01-07 DIAGNOSIS — N401 Enlarged prostate with lower urinary tract symptoms: Secondary | ICD-10-CM | POA: Diagnosis not present

## 2018-01-07 LAB — POC URINALSYSI DIPSTICK (AUTOMATED)
Bilirubin, UA: NEGATIVE
Glucose, UA: NEGATIVE
Ketones, UA: NEGATIVE
LEUKOCYTES UA: NEGATIVE
NITRITE UA: NEGATIVE
PH UA: 6 (ref 5.0–8.0)
PROTEIN UA: NEGATIVE
Spec Grav, UA: 1.015 (ref 1.010–1.025)
UROBILINOGEN UA: 0.2 U/dL

## 2018-01-07 LAB — CBC WITH DIFFERENTIAL/PLATELET
BASOS ABS: 0 10*3/uL (ref 0.0–0.1)
Basophils Relative: 0.4 % (ref 0.0–3.0)
EOS PCT: 0.7 % (ref 0.0–5.0)
Eosinophils Absolute: 0.1 10*3/uL (ref 0.0–0.7)
HCT: 46.3 % (ref 39.0–52.0)
HEMOGLOBIN: 16.3 g/dL (ref 13.0–17.0)
Lymphocytes Relative: 16.7 % (ref 12.0–46.0)
Lymphs Abs: 1.4 10*3/uL (ref 0.7–4.0)
MCHC: 35.2 g/dL (ref 30.0–36.0)
MCV: 93.3 fl (ref 78.0–100.0)
MONOS PCT: 5.2 % (ref 3.0–12.0)
Monocytes Absolute: 0.4 10*3/uL (ref 0.1–1.0)
NEUTROS PCT: 77 % (ref 43.0–77.0)
Neutro Abs: 6.5 10*3/uL (ref 1.4–7.7)
Platelets: 213 10*3/uL (ref 150.0–400.0)
RBC: 4.96 Mil/uL (ref 4.22–5.81)
RDW: 12.1 % (ref 11.5–15.5)
WBC: 8.4 10*3/uL (ref 4.0–10.5)

## 2018-01-07 LAB — HEPATIC FUNCTION PANEL
ALT: 15 U/L (ref 0–53)
AST: 16 U/L (ref 0–37)
Albumin: 4.2 g/dL (ref 3.5–5.2)
Alkaline Phosphatase: 65 U/L (ref 39–117)
BILIRUBIN TOTAL: 0.9 mg/dL (ref 0.2–1.2)
Bilirubin, Direct: 0.2 mg/dL (ref 0.0–0.3)
Total Protein: 6.5 g/dL (ref 6.0–8.3)

## 2018-01-07 LAB — LIPID PANEL
CHOL/HDL RATIO: 3
Cholesterol: 148 mg/dL (ref 0–200)
HDL: 58.2 mg/dL (ref >39.00)
LDL Cholesterol: 78 mg/dL (ref 0–99)
NONHDL: 89.61
Triglycerides: 57 mg/dL (ref 0.0–149.0)
VLDL: 11.4 mg/dL (ref 0.0–40.0)

## 2018-01-07 LAB — BASIC METABOLIC PANEL
BUN: 12 mg/dL (ref 6–23)
CALCIUM: 9.2 mg/dL (ref 8.4–10.5)
CO2: 29 meq/L (ref 19–32)
Chloride: 102 mEq/L (ref 96–112)
Creatinine, Ser: 0.83 mg/dL (ref 0.40–1.50)
GFR: 104.9 mL/min (ref >60.00)
Glucose, Bld: 90 mg/dL (ref 70–99)
Potassium: 4.3 mEq/L (ref 3.5–5.1)
SODIUM: 139 meq/L (ref 135–145)

## 2018-01-07 LAB — PSA: PSA: 0.64 ng/mL (ref 0.10–4.00)

## 2018-01-07 LAB — TSH: TSH: 1.28 u[IU]/mL (ref 0.35–4.50)

## 2018-01-07 MED ORDER — TRIAMCINOLONE ACETONIDE 0.1 % EX CREA: 1.0000 "application " | TOPICAL_CREAM | Freq: Two times a day (BID) | CUTANEOUS | 5 refills | Status: DC

## 2018-01-07 NOTE — Progress Notes (Signed)
   Subjective:    Patient ID: Trevor Hernandez, male    DOB: 18-Nov-1970, 47 y.o.   MRN: [id number]  HPI Here for a well exam. He feels great. He has adopted a high protein, low carb diet and his hypoglycemic episodes have become quite infrequent. He knows to avoid sugary foods.    Review of Systems  Constitutional: Negative.   HENT: Negative.   Eyes: Negative.   Respiratory: Negative.   Cardiovascular: Negative.   Gastrointestinal: Negative.   Genitourinary: Negative.   Musculoskeletal: Negative.   Skin: Negative.   Neurological: Negative.   Psychiatric/Behavioral: Negative.        Objective:   Physical Exam  Constitutional: He is oriented to person, place, and time. He appears well-developed and well-nourished. No distress.  HENT:  Head: Normocephalic and atraumatic.  Right Ear: External ear normal.  Left Ear: External ear normal.  Nose: Nose normal.  Mouth/Throat: Oropharynx is clear and moist. No oropharyngeal exudate.  Eyes: Pupils are equal, round, and reactive to light. Conjunctivae and EOM are normal. Right eye exhibits no discharge. Left eye exhibits no discharge. No scleral icterus.  Neck: Neck supple. No JVD present. No tracheal deviation present. No thyromegaly present.  Cardiovascular: Normal rate, regular rhythm, normal heart sounds and intact distal pulses. Exam reveals no gallop and no friction rub.  No murmur heard. Pulmonary/Chest: Effort normal and breath sounds normal. No respiratory distress. He has no wheezes. He has no rales. He exhibits no tenderness.  Abdominal: Soft. Bowel sounds are normal. He exhibits no distension and no mass. There is no tenderness. There is no rebound and no guarding.  Genitourinary: Rectum normal, prostate normal and penis normal. Rectal exam shows guaiac negative stool. No penile tenderness.  Musculoskeletal: Normal range of motion. He exhibits no edema or tenderness.  Lymphadenopathy:    He has no cervical adenopathy.    Neurological: He is alert and oriented to person, place, and time. He has normal reflexes. He displays normal reflexes. No cranial nerve deficit. He exhibits normal muscle tone. Coordination normal.  Skin: Skin is warm and dry. No rash noted. He is not diaphoretic. No erythema. No pallor.  Psychiatric: He has a normal mood and affect. His behavior is normal. Judgment and thought content normal.          Assessment & Plan:  Well exam. We discussed diet and exercise. Get fasting labs.  Alysia Penna, MD

## 2019-05-23 ENCOUNTER — Encounter: Payer: BC Managed Care – PPO | Admitting: Family Medicine

## 2019-05-30 ENCOUNTER — Encounter: Payer: Self-pay | Admitting: Family Medicine

## 2019-06-06 ENCOUNTER — Other Ambulatory Visit: Payer: Self-pay

## 2019-06-06 ENCOUNTER — Encounter: Payer: Self-pay | Admitting: Family Medicine

## 2019-06-06 ENCOUNTER — Ambulatory Visit: Payer: BC Managed Care – PPO | Admitting: Family Medicine

## 2019-06-06 VITALS — BP 90/70 | HR 70 | Temp 98.0°F | Ht 68.0 in | Wt 165.4 lb

## 2019-06-06 DIAGNOSIS — M25551 Pain in right hip: Secondary | ICD-10-CM

## 2019-06-06 DIAGNOSIS — G8929 Other chronic pain: Secondary | ICD-10-CM | POA: Diagnosis not present

## 2019-06-06 NOTE — Progress Notes (Signed)
   Subjective:    Patient ID: Trevor Hernandez, male    DOB: Oct 17, 1970, 48 y.o.   MRN: [id number]  HPI Here for 3 months of intermittent pain in the right hip area. No hx of trauma. He does yoga, and he has learned to avoid positions that exacerbate this pain. The pain is centered in the lateral hip. No back pain. He has done nothing for this as yet.    Review of Systems  Respiratory: Negative.   Cardiovascular: Negative.   Musculoskeletal: Positive for arthralgias.       Objective:   Physical Exam Constitutional:      Appearance: Normal appearance.  Cardiovascular:     Rate and Rhythm: Normal rate and regular rhythm.     Pulses: Normal pulses.     Heart sounds: Normal heart sounds.  Pulmonary:     Effort: Pulmonary effort is normal.     Breath sounds: Normal breath sounds.  Musculoskeletal:     Comments: He is tender over the lateral right hip area, but not over the greater trochanter exactly. The hip shows full ROM without pain. The back and both SI joints are intact  Neurological:     Mental Status: He is alert.           Assessment & Plan:  Right hip pain. This seems to be a muscular pain. He can try ice packs and taking Aleve BID. We will refer him to PT for this.  Alysia Penna, MD

## 2019-06-06 NOTE — Patient Instructions (Signed)
Health Maintenance Due  Topic Date Due  . HIV Screening  07/24/1984  . TETANUS/TDAP  07/24/1988  . INFLUENZA VACCINE  03/05/2019    Depression screen PHQ 2/9 08/24/2017  Decreased Interest 0  Down, Depressed, Hopeless 0  PHQ - 2 Score 0

## 2019-06-13 ENCOUNTER — Ambulatory Visit: Payer: BC Managed Care – PPO | Attending: Family Medicine

## 2019-06-13 ENCOUNTER — Other Ambulatory Visit: Payer: Self-pay

## 2019-06-13 DIAGNOSIS — M25651 Stiffness of right hip, not elsewhere classified: Secondary | ICD-10-CM | POA: Insufficient documentation

## 2019-06-13 DIAGNOSIS — M25551 Pain in right hip: Secondary | ICD-10-CM | POA: Diagnosis present

## 2019-06-13 NOTE — Therapy (Signed)
[facility] [address], [place], [place], [address] Phone: [phone]   Fax:  [phone]  Physical Therapy Evaluation  Patient Details  Name: Trevor Hernandez MRN: [id number] Date of Birth: 1970/11/15 Referring Provider (PT): Alysia Penna MD   Encounter Date: 06/13/2019  PT End of Session - 06/13/19 1552    Visit Number  1    Number of Visits  7    Date for PT Re-Evaluation  08/01/19    PT Start Time  [id number]    PT Stop Time  1530    PT Time Calculation (min)  45 min    Activity Tolerance  Patient tolerated treatment well    Behavior During Therapy  [facility] for tasks assessed/performed       Past Medical History:  Diagnosis Date  . Epididymitis    sees Dr. Rolan Bucco   . Hypoglycemia    sees Dr. Cruzita Lederer     No past surgical history on file.  There were no vitals filed for this visit.   Subjective Assessment - 06/13/19 1447    Subjective  Pt reports that over the past couple of years he has gotten into yoga. He has been exercising his whole life. Over the past years he has gotten loosened up but over the past summer especially back in July when he started to notice a burning sensation in his R hip that would radiate up into his R buttock area when he was going into Lotus/figure 4 position. He states that he was feeling a spasm in his buttock area from his hip to his low back. He states that any hip opener exercises really aggravate his pain the next day.    Pertinent History  no significant medical history    Limitations  Sitting;Walking    How long can you sit comfortably?  5 minutes on the floor, all day with some discomfort    How long can you walk comfortably?  he states that with full strides he will notice pinching in his R hip on the lateral aspect    Diagnostic tests  no imaging done at this time    Patient Stated Goals  I want to get the pain down.    Currently in Pain?  Yes    Pain Score  5      Pain Location  Hip    Pain Orientation  Right;Posterior    Pain Descriptors / Indicators  Shooting    Pain Type  Chronic pain    Pain Radiating Towards  radiates into buttock and into the R hip    Pain Onset  More than a month ago    Pain Frequency  Intermittent    Aggravating Factors   figure 4 position, prolonged gait    Pain Relieving Factors  rest, sitting    Effect of Pain on Daily Activities  pt has pain with activities he previously did not have pain when performing. Pt states that he is no longer playing tennis because of the pain and is not as active as he was previously.         [facility] PT Assessment - 06/13/19 0001      Assessment   Medical Diagnosis  R hip pain    Referring Provider (PT)  Alysia Penna MD    Next MD Visit  07/22/2019    Prior Therapy  not for this issue      Balance Screen   Has the patient fallen in  the past 6 months  No    Has the patient had a decrease in activity level because of a fear of falling?   Yes    Is the patient reluctant to leave their home because of a fear of falling?   No      Home Film/video editor residence    Living Arrangements  Children;Spouse/significant other    Type of Liberty to enter    Entrance Stairs-Number of Steps  2    Home Layout  Two level    Alternate Level Stairs-Number of Steps  13    Alternate Level Stairs-Rails  Right      Prior Function   Level of Independence  Independent    Vocation  Full time employment    Vocation Requirements  Pt reports that he is on the computer a lot for work due to [employer], run, yoga      Cognition   Overall Cognitive Status  Within Functional Limits for tasks assessed      Posture/Postural Control   Posture/Postural Control  No significant limitations      ROM / Strength   AROM / PROM / Strength  Strength;AROM      AROM   AROM Assessment Site  Lumbar;Hip    Right/Left Hip  Right    Right Hip Flexion   130    Right Hip ABduction  20    Lumbar Flexion  WFL     Lumbar Extension  WFL    Lumbar - Right Side Bend  WFL    Lumbar - Left Side Bend  WFL    Lumbar - Right Rotation  WFL    Lumbar - Left Rotation  [facility]      Strength   Strength Assessment Site  Hip;Knee;Ankle    Right/Left Hip  Right;Left    Right Hip Flexion  5/5    Right Hip ABduction  4+/5    Right Hip ADduction  4/5    Left Hip Flexion  5/5    Left Hip ABduction  5/5    Left Hip ADduction  4+/5    Right/Left Knee  Right;Left    Right Knee Flexion  5/5    Right Knee Extension  5/5    Left Knee Flexion  5/5    Left Knee Extension  5/5    Right/Left Ankle  Right;Left    Right Ankle Dorsiflexion  5/5    Left Ankle Dorsiflexion  5/5                Objective measurements completed on examination: See above findings.      [facility] PT Treatment/Exercise - 06/13/19 0001      Exercises   Exercises  Knee/Hip      Knee/Hip Exercises: Sidelying   Hip ABduction  Strengthening;Right;1 set;10 reps    Hip ABduction Limitations  Tactile cueing at pelvis to prevent posterior rotation compensation signficant fatigue.     Clams  S/L clamshell with slow eccentric movement in order to address possible tendonitis at the piriformis with slight increase in pain and VC to avoid significant pain. Tactile cueing to prevent compensation w/ER compensation with posterior rotation of the pelvis.       Knee/Hip Exercises: Prone   Other Prone Exercises  Prone hip extension with knee bent with VC for correct movement 10x with fatigue  PT Education - 06/13/19 1550    Education Details  Access Code: [email or web address], pt was educated on the anatomy of the hip including muscle/bone structure. Discussed not stretching at this time in order to decrease stress on the tendon and pt was educated on how to foam roll to address tightness noted in the R ITB with demonstration and return demonstration. Discussed avoiding activities in  yoga especially in SLS that aggravate symptoms to decrease inflammation at the R hip at this time.    Person(s) Educated  Patient    Methods  Explanation;Demonstration;Verbal cues;Tactile cues;Handout    Comprehension  Verbalized understanding;Returned demonstration       PT Short Term Goals - 06/13/19 1557      PT SHORT TERM GOAL #1   Title  pt will be independent with HEP for autonomy of care.    Baseline  pt currently does not have an HEP    Time  3    Period  Weeks    Status  New    Target Date  07/11/19        PT Long Term Goals - 06/13/19 1557      PT LONG TERM GOAL #1   Title  pt will report pain 3/10 within 6 weeks in order to improve quality of life and participate in activities without pain restrictions.    Baseline  5/10    Time  6    Period  Weeks    Status  New    Target Date  08/01/19      PT LONG TERM GOAL #2   Title  Pt will increase strenght in hip hip abdct to 5/5 in order to demonstrate improved functional strength    Baseline  R hip abdct 4/5    Time  6    Period  Weeks    Status  New    Target Date  08/01/19      PT LONG TERM GOAL #3   Title  Pt will improve R hip abd to 30 degrees within 6 weeks in order to demonstrate an improved pain free ROM into hip abd    Baseline  20 degrees R hip abd    Time  6    Period  Weeks    Status  New    Target Date  08/01/19             Plan - 06/13/19 1552    Clinical Impression Statement  Pt presents to physical therapy with pain in the R hip especially with stretching into a figure 4 position. He has palpable tenderness noted at the iliac crest over the insertion of the glute medius/glute maximus and along the neck of the piriformis and down the R ITB. Pt has some limitations in A/ROM of the R hip into abduction due to weakness in the R hip compared to the L. Pt was negative for FABER test and has increased pain with activation of hip ER and abductors. Pt will benefit from skilled physical therapy  services in order to address the above limitations.    Examination-Activity Limitations  Locomotion Level    Stability/Clinical Decision Making  Stable/Uncomplicated    Clinical Decision Making  Low    Rehab Potential  Excellent    PT Frequency  1x / week   pt preference due to work   PT Duration  6 weeks    PT Treatment/Interventions  ADLs/Self Care Home Management;Iontophoresis 4mg /ml Dexamethasone;Electrical Stimulation;Ultrasound;Gait training;Stair training;Functional mobility training;Therapeutic activities;Therapeutic exercise;Balance  training;Neuromuscular re-education;Patient/family education;Dry needling;Manual techniques    PT Next Visit Plan  Assess HEP/foam rolling, Progress pelvic/core stabilization ther-ex. Dry needling? Pt needs education on this it was not provided at evaluation.    PT Home Exercise Plan  Access Code: [email or web address]    Consulted and Agree with Plan of Care  Patient       Patient will benefit from skilled therapeutic intervention in order to improve the following deficits and impairments:  Decreased range of motion, Pain, Decreased strength  Visit Diagnosis: Pain in right hip  Stiffness of right hip, not elsewhere classified     Problem List Patient Active Problem List   Diagnosis Date Noted  . Hypoglycemia 07/17/2017  . Seasonal allergies 09/21/2013  . Acute sinusitis 09/18/2012    Ander Purpura, PT 06/13/2019, 4:01 PM  [facility] [address], [place], [place], [address] Phone: [phone]   Fax:  [phone]  Name: Taryll Szymborski MRN: [id number] Date of Birth: 1971/02/14

## 2019-06-13 NOTE — Patient Instructions (Signed)
Access Code: [email or web address]  URL: [email or web address]  Date: 06/13/2019  Prepared by: Tomma Rakers   Exercises Clamshell - 10 reps - 1 sets - 2x daily - 7x weekly Sidelying Hip Abduction - 10 reps - 1 sets - 2x daily - 7x weekly Prone Hip Extension with Bent Knee - 10 reps - 1 sets - 2x daily - 7x weekly Sidelying IT Band Foam Roll Mobilization - 1 reps - 1 sets - do this for 2 minutes or so hold - 2x daily - 7x weekly Gluteus Mobilization with Foam Roll - 1 reps - 1 sets - do this for 2 minutes or so hold - 2x daily - 7x weekly

## 2019-06-22 ENCOUNTER — Ambulatory Visit: Payer: BC Managed Care – PPO | Admitting: Physical Therapy

## 2019-07-05 ENCOUNTER — Encounter: Payer: BC Managed Care – PPO | Admitting: Physical Therapy

## 2019-07-12 ENCOUNTER — Encounter: Payer: BC Managed Care – PPO | Admitting: Physical Therapy

## 2019-07-21 ENCOUNTER — Encounter: Payer: BC Managed Care – PPO | Admitting: Physical Therapy

## 2019-07-21 ENCOUNTER — Other Ambulatory Visit: Payer: Self-pay

## 2019-07-22 ENCOUNTER — Encounter: Payer: Self-pay | Admitting: Family Medicine

## 2019-07-22 ENCOUNTER — Ambulatory Visit (INDEPENDENT_AMBULATORY_CARE_PROVIDER_SITE_OTHER): Payer: BC Managed Care – PPO | Admitting: Family Medicine

## 2019-07-22 VITALS — BP 120/60 | HR 92 | Temp 97.8°F | Wt 164.8 lb

## 2019-07-22 DIAGNOSIS — Z0000 Encounter for general adult medical examination without abnormal findings: Secondary | ICD-10-CM | POA: Diagnosis not present

## 2019-07-22 DIAGNOSIS — Z23 Encounter for immunization: Secondary | ICD-10-CM

## 2019-07-22 MED ORDER — DOXYCYCLINE HYCLATE 100 MG PO CAPS: 100.0000 mg | ORAL_CAPSULE | Freq: Two times a day (BID) | ORAL | 0 refills | Status: AC

## 2019-07-22 NOTE — Addendum Note (Signed)
Addended by: Rebecca Eaton on: 07/22/2019 02:13 PM   Modules accepted: Orders

## 2019-07-22 NOTE — Progress Notes (Signed)
Subjective:    Patient ID: Trevor Hernandez, male    DOB: [birth year], 48 y.o.   MRN: [id number]  HPI Here for a well exam. He has a few issues to discuss. First he had been having right hip pain and he went to PT. They diagnosed him with piriformis syndrome and gave him some exercises to do at home. This is slowly getting better now and hardly bothers him at all. He asks me to check some areas of rough skin on his forehead. He also mentions some intermittent dull aching pains in the perineal area for the past 4 months. These start at the testicles and radiate around to the anus. No problems with urinations or BMs. No fevers.    Review of Systems  Constitutional: Negative.   HENT: Negative.   Eyes: Negative.   Respiratory: Negative.   Cardiovascular: Negative.   Gastrointestinal: Negative.   Genitourinary: Negative.   Musculoskeletal: Positive for arthralgias.  Skin: Negative.   Neurological: Negative.   Psychiatric/Behavioral: Negative.        Objective:   Physical Exam Constitutional:      General: He is not in acute distress.    Appearance: He is well-developed. He is not diaphoretic.  HENT:     Head: Normocephalic and atraumatic.     Right Ear: External ear normal.     Left Ear: External ear normal.     Nose: Nose normal.     Mouth/Throat:     Pharynx: No oropharyngeal exudate.  Eyes:     General: No scleral icterus.       Right eye: No discharge.        Left eye: No discharge.     Conjunctiva/sclera: Conjunctivae normal.     Pupils: Pupils are equal, round, and reactive to light.  Neck:     Thyroid: No thyromegaly.     Vascular: No JVD.     Trachea: No tracheal deviation.  Cardiovascular:     Rate and Rhythm: Normal rate and regular rhythm.     Heart sounds: Normal heart sounds. No murmur. No friction rub. No gallop.   Pulmonary:     Effort: Pulmonary effort is normal. No respiratory distress.     Breath sounds: Normal breath sounds. No wheezing or rales.    Chest:     Chest wall: No tenderness.  Abdominal:     General: Bowel sounds are normal. There is no distension.     Palpations: Abdomen is soft. There is no mass.     Tenderness: There is no abdominal tenderness. There is no guarding or rebound.  Genitourinary:    Penis: Normal. No tenderness.      Testes: Normal.     Rectum: Normal. Guaiac result negative.     Comments: The prostate is slightly enlarged and fluctuant, but not tender  Musculoskeletal:        General: No tenderness. Normal range of motion.     Cervical back: Neck supple.  Lymphadenopathy:     Cervical: No cervical adenopathy.  Skin:    General: Skin is warm and dry.     Coloration: Skin is not pale.     Findings: No rash.     Comments: He has areas of rough scaly skin on the forehead   Neurological:     Mental Status: He is alert and oriented to person, place, and time.     Cranial Nerves: No cranial nerve deficit.     Motor: No abnormal muscle tone.  Coordination: Coordination normal.     Deep Tendon Reflexes: Reflexes are normal and symmetric. Reflexes normal.  Psychiatric:        Behavior: Behavior normal.        Thought Content: Thought content normal.        Judgment: Judgment normal.           Assessment & Plan:  Well exam. We discussed diet and exercise. Set up fasting labs soon. Hopefully the piriformis pain will resolve soon. He seems to have a mild prostatitis, so we will treat this with Doxycycline. He has some actinic damage on the forehead so he will use sun block and wear wide brim hats outside. He sees his Dermatologist, Dr. Amy Martinique, in January so he will address this with her. Given a TDaP.  Alysia Penna, MD

## 2019-07-26 ENCOUNTER — Other Ambulatory Visit: Payer: BC Managed Care – PPO

## 2019-07-27 ENCOUNTER — Encounter: Payer: Self-pay | Admitting: Gastroenterology

## 2019-08-01 ENCOUNTER — Other Ambulatory Visit: Payer: Self-pay

## 2019-08-01 ENCOUNTER — Other Ambulatory Visit (INDEPENDENT_AMBULATORY_CARE_PROVIDER_SITE_OTHER): Payer: BC Managed Care – PPO

## 2019-08-01 DIAGNOSIS — Z0000 Encounter for general adult medical examination without abnormal findings: Secondary | ICD-10-CM | POA: Diagnosis not present

## 2019-08-01 LAB — CBC WITH DIFFERENTIAL/PLATELET
Basophils Absolute: 0 10*3/uL (ref 0.0–0.1)
Basophils Relative: 0.4 % (ref 0.0–3.0)
Eosinophils Absolute: 0.2 10*3/uL (ref 0.0–0.7)
Eosinophils Relative: 3 % (ref 0.0–5.0)
HCT: 45.9 % (ref 39.0–52.0)
Hemoglobin: 16.2 g/dL (ref 13.0–17.0)
Lymphocytes Relative: 34 % (ref 12.0–46.0)
Lymphs Abs: 1.8 10*3/uL (ref 0.7–4.0)
MCHC: 35.4 g/dL (ref 30.0–36.0)
MCV: 92 fl (ref 78.0–100.0)
Monocytes Absolute: 0.5 10*3/uL (ref 0.1–1.0)
Monocytes Relative: 9.2 % (ref 3.0–12.0)
Neutro Abs: 2.9 10*3/uL (ref 1.4–7.7)
Neutrophils Relative %: 53.4 % (ref 43.0–77.0)
Platelets: 216 10*3/uL (ref 150.0–400.0)
RBC: 4.99 Mil/uL (ref 4.22–5.81)
RDW: 12.3 % (ref 11.5–15.5)
WBC: 5.4 10*3/uL (ref 4.0–10.5)

## 2019-08-01 LAB — LIPID PANEL
Cholesterol: 161 mg/dL (ref 0–200)
HDL: 53.5 mg/dL (ref >39.00)
LDL Cholesterol: 93 mg/dL (ref 0–99)
NonHDL: 107.29
Total CHOL/HDL Ratio: 3
Triglycerides: 70 mg/dL (ref 0.0–149.0)
VLDL: 14 mg/dL (ref 0.0–40.0)

## 2019-08-01 LAB — HEPATIC FUNCTION PANEL
ALT: 14 U/L (ref 0–53)
AST: 18 U/L (ref 0–37)
Albumin: 4.3 g/dL (ref 3.5–5.2)
Alkaline Phosphatase: 64 U/L (ref 39–117)
Bilirubin, Direct: 0.2 mg/dL (ref 0.0–0.3)
Total Bilirubin: 0.9 mg/dL (ref 0.2–1.2)
Total Protein: 6.6 g/dL (ref 6.0–8.3)

## 2019-08-01 LAB — URINALYSIS
Bilirubin Urine: NEGATIVE
Hgb urine dipstick: NEGATIVE
Ketones, ur: NEGATIVE
Leukocytes,Ua: NEGATIVE
Nitrite: NEGATIVE
Specific Gravity, Urine: 1.02 (ref 1.000–1.030)
Total Protein, Urine: NEGATIVE
Urine Glucose: NEGATIVE
Urobilinogen, UA: 0.2 (ref 0.0–1.0)
pH: 7 (ref 5.0–8.0)

## 2019-08-01 LAB — TSH: TSH: 1.1 u[IU]/mL (ref 0.35–4.50)

## 2019-08-01 LAB — BASIC METABOLIC PANEL
BUN: 12 mg/dL (ref 6–23)
CO2: 28 mEq/L (ref 19–32)
Calcium: 9.3 mg/dL (ref 8.4–10.5)
Chloride: 106 mEq/L (ref 96–112)
Creatinine, Ser: 0.89 mg/dL (ref 0.40–1.50)
GFR: 90.47 mL/min (ref >60.00)
Glucose, Bld: 97 mg/dL (ref 70–99)
Potassium: 4.4 mEq/L (ref 3.5–5.1)
Sodium: 140 mEq/L (ref 135–145)

## 2019-08-01 LAB — PSA: PSA: 0.36 ng/mL (ref 0.10–4.00)

## 2019-08-17 ENCOUNTER — Other Ambulatory Visit: Payer: Self-pay

## 2019-08-17 ENCOUNTER — Encounter: Payer: Self-pay | Admitting: Gastroenterology

## 2019-08-17 ENCOUNTER — Ambulatory Visit (AMBULATORY_SURGERY_CENTER): Payer: Self-pay

## 2019-08-17 VITALS — Temp 97.8°F | Ht 68.0 in | Wt 163.8 lb

## 2019-08-17 DIAGNOSIS — Z1211 Encounter for screening for malignant neoplasm of colon: Secondary | ICD-10-CM

## 2019-08-17 DIAGNOSIS — Z01818 Encounter for other preprocedural examination: Secondary | ICD-10-CM

## 2019-08-17 MED ORDER — PLENVU 140 G PO SOLR: 1.0000 | Freq: Once | ORAL | 0 refills | Status: AC

## 2019-08-17 NOTE — Progress Notes (Signed)

## 2019-08-18 ENCOUNTER — Encounter: Payer: Self-pay | Admitting: Gastroenterology

## 2019-08-26 ENCOUNTER — Ambulatory Visit (INDEPENDENT_AMBULATORY_CARE_PROVIDER_SITE_OTHER): Payer: BC Managed Care – PPO

## 2019-08-26 ENCOUNTER — Other Ambulatory Visit: Payer: Self-pay | Admitting: Gastroenterology

## 2019-08-26 DIAGNOSIS — Z1159 Encounter for screening for other viral diseases: Secondary | ICD-10-CM

## 2019-08-29 LAB — SARS CORONAVIRUS 2 (TAT 6-24 HRS): SARS Coronavirus 2: NEGATIVE

## 2019-08-31 ENCOUNTER — Other Ambulatory Visit: Payer: Self-pay

## 2019-08-31 ENCOUNTER — Encounter: Payer: Self-pay | Admitting: Gastroenterology

## 2019-08-31 ENCOUNTER — Ambulatory Visit (AMBULATORY_SURGERY_CENTER): Payer: BC Managed Care – PPO | Admitting: Gastroenterology

## 2019-08-31 VITALS — BP 93/55 | HR 66 | Temp 96.9°F | Resp 14 | Ht 68.0 in | Wt 163.8 lb

## 2019-08-31 DIAGNOSIS — Z1211 Encounter for screening for malignant neoplasm of colon: Secondary | ICD-10-CM | POA: Diagnosis not present

## 2019-08-31 HISTORY — PX: COLONOSCOPY: SHX174

## 2019-08-31 MED ORDER — SODIUM CHLORIDE 0.9 % IV SOLN: 500.0000 mL | Freq: Once | INTRAVENOUS | Status: DC

## 2019-08-31 NOTE — Op Note (Signed)
[place] Patient Name: Trevor Hernandez Procedure Date: 08/31/2019 7:22 AM MRN: [id number] Endoscopist: Ladene Artist , MD Age: 49 Referring MD:  Date of Birth: 11-Oct-1970 Gender: Male Account #: [id number] Procedure:                Colonoscopy Indications:              Screening for colorectal malignant neoplasm Medicines:                Monitored Anesthesia Care Procedure:                Pre-Anesthesia Assessment:                           - Prior to the procedure, a History and Physical                            was performed, and patient medications and                            allergies were reviewed. The patient's tolerance of                            previous anesthesia was also reviewed. The risks                            and benefits of the procedure and the sedation                            options and risks were discussed with the patient.                            All questions were answered, and informed consent                            was obtained. Prior Anticoagulants: The patient has                            taken no previous anticoagulant or antiplatelet                            agents. ASA Grade Assessment: II - A patient with                            mild systemic disease. After reviewing the risks                            and benefits, the patient was deemed in                            satisfactory condition to undergo the procedure.                           After obtaining informed consent, the colonoscope  was passed under direct vision. Throughout the                            procedure, the patient's blood pressure, pulse, and                            oxygen saturations were monitored continuously. The                            Colonoscope was introduced through the anus and                            advanced to the the cecum, identified by                            appendiceal orifice and  ileocecal valve. The                            ileocecal valve, appendiceal orifice, and rectum                            were photographed. The quality of the bowel                            preparation was excellent. The colonoscopy was                            performed without difficulty. The patient tolerated                            the procedure well. Scope In: 8:08:41 AM Scope Out: 8:21:45 AM Scope Withdrawal Time: 0 hours 11 minutes 9 seconds  Total Procedure Duration: 0 hours 13 minutes 4 seconds  Findings:                 The perianal and digital rectal examinations were                            normal.                           Internal hemorrhoids were found during                            retroflexion. The hemorrhoids were small and Grade                            I (internal hemorrhoids that do not prolapse).                           The exam was otherwise without abnormality on                            direct and retroflexion views. Complications:            No immediate complications. Estimated blood loss:  None. Estimated Blood Loss:     Estimated blood loss: none. Impression:               - Internal hemorrhoids.                           - The examination was otherwise normal on direct                            and retroflexion views.                           - No specimens collected. Recommendation:           - Repeat colonoscopy in 10 years for screening                            purposes.                           - Patient has a contact number available for                            emergencies. The signs and symptoms of potential                            delayed complications were discussed with the                            patient. Return to normal activities tomorrow.                            Written discharge instructions were provided to the                            patient.                           -  Resume previous diet.                           - Continue present medications. Ladene Artist, MD 08/31/2019 8:24:20 AM This report has been signed electronically.

## 2019-08-31 NOTE — Patient Instructions (Signed)
YOU HAD AN ENDOSCOPIC PROCEDURE TODAY AT THE [facility]:   Refer to the procedure report that was given to you for any specific questions about what was found during the examination.  If the procedure report does not answer your questions, please call your gastroenterologist to clarify.  If you requested that your care partner not be given the details of your procedure findings, then the procedure report has been included in a sealed envelope for you to review at your convenience later.  YOU SHOULD EXPECT: Some feelings of bloating in the abdomen. Passage of more gas than usual.  Walking can help get rid of the air that was put into your GI tract during the procedure and reduce the bloating. If you had a lower endoscopy (such as a colonoscopy or flexible sigmoidoscopy) you may notice spotting of blood in your stool or on the toilet paper. If you underwent a bowel prep for your procedure, you may not have a normal bowel movement for a few days.  Please Note:  You might notice some irritation and congestion in your nose or some drainage.  This is from the oxygen used during your procedure.  There is no need for concern and it should clear up in a day or so.  SYMPTOMS TO REPORT IMMEDIATELY:   Following lower endoscopy (colonoscopy or flexible sigmoidoscopy):  Excessive amounts of blood in the stool  Significant tenderness or worsening of abdominal pains  Swelling of the abdomen that is new, acute  Fever of 100F or higher  For urgent or emergent issues, a gastroenterologist can be reached at any hour by calling [phone].   DIET:  We do recommend a small meal at first, but then you may proceed to your regular diet.  Drink plenty of fluids but you should avoid alcoholic beverages for 24 hours.  ACTIVITY:  You should plan to take it easy for the rest of today and you should NOT DRIVE or use heavy machinery until tomorrow (because of the sedation medicines used during the test).     FOLLOW UP: Our staff will call the number listed on your records 48-72 hours following your procedure to check on you and address any questions or concerns that you may have regarding the information given to you following your procedure. If we do not reach you, we will leave a message.  We will attempt to reach you two times.  During this call, we will ask if you have developed any symptoms of COVID 19. If you develop any symptoms (ie: fever, flu-like symptoms, shortness of breath, cough etc.) before then, please call [phone].  If you test positive for Covid 19 in the 2 weeks post procedure, please call and report this information to us.    If any biopsies were taken you will be contacted by phone or by letter within the next 1-3 weeks.  Please call us at [phone] if you have not heard about the biopsies in 3 weeks.    SIGNATURES/CONFIDENTIALITY: You and/or your care partner have signed paperwork which will be entered into your electronic medical record.  These signatures attest to the fact that that the information above on your After Visit Summary has been reviewed and is understood.  Full responsibility of the confidentiality of this discharge information lies with you and/or your care-partner. 

## 2019-08-31 NOTE — Progress Notes (Signed)
PT taken to PACU. Monitors in place. VSS. Report given to RN. 

## 2019-09-02 ENCOUNTER — Telehealth: Payer: Self-pay

## 2019-09-02 NOTE — Telephone Encounter (Signed)
  Follow up Call-  Call back number 08/31/2019  Post procedure Call Back phone  # [phone]  Permission to leave phone message Yes  Some recent data might be hidden     Patient questions:  Do you have a fever, pain , or abdominal swelling? No. Pain Score  0 *  Have you tolerated food without any problems? Yes.    Have you been able to return to your normal activities? Yes.    Do you have any questions about your discharge instructions: Diet   No. Medications  No. Follow up visit  No.  Do you have questions or concerns about your Care? No.  Actions: * If pain score is 4 or above: No action needed, pain <4.  1. Have you developed a fever since your procedure? no  2.   Have you had an respiratory symptoms (SOB or cough) since your procedure? no  3.   Have you tested positive for COVID 19 since your procedure no  4.   Have you had any family members/close contacts diagnosed with the COVID 19 since your procedure?  no   If yes to any of these questions please route to Joylene John, RN and Alphonsa Gin, Therapist, sports.

## 2019-09-02 NOTE — Telephone Encounter (Signed)
First attempt follow up call to pt, lm on vm 

## 2019-09-06 NOTE — Telephone Encounter (Signed)
Entered in error. Maw

## 2020-12-04 ENCOUNTER — Other Ambulatory Visit: Payer: Self-pay

## 2020-12-05 ENCOUNTER — Encounter: Payer: Self-pay | Admitting: Family Medicine

## 2020-12-05 ENCOUNTER — Ambulatory Visit (INDEPENDENT_AMBULATORY_CARE_PROVIDER_SITE_OTHER): Payer: BC Managed Care – PPO | Admitting: Family Medicine

## 2020-12-05 VITALS — BP 98/68 | HR 68 | Temp 98.2°F | Ht 68.0 in | Wt 151.0 lb

## 2020-12-05 DIAGNOSIS — Z0000 Encounter for general adult medical examination without abnormal findings: Secondary | ICD-10-CM | POA: Diagnosis not present

## 2020-12-05 DIAGNOSIS — Z125 Encounter for screening for malignant neoplasm of prostate: Secondary | ICD-10-CM

## 2020-12-05 LAB — CBC WITH DIFFERENTIAL/PLATELET
Basophils Absolute: 0 10*3/uL (ref 0.0–0.1)
Basophils Relative: 0.5 % (ref 0.0–3.0)
Eosinophils Absolute: 0.1 10*3/uL (ref 0.0–0.7)
Eosinophils Relative: 1.3 % (ref 0.0–5.0)
HCT: 44.4 % (ref 39.0–52.0)
Hemoglobin: 15.7 g/dL (ref 13.0–17.0)
Lymphocytes Relative: 22.1 % (ref 12.0–46.0)
Lymphs Abs: 1.4 10*3/uL (ref 0.7–4.0)
MCHC: 35.3 g/dL (ref 30.0–36.0)
MCV: 91.4 fl (ref 78.0–100.0)
Monocytes Absolute: 0.4 10*3/uL (ref 0.1–1.0)
Monocytes Relative: 6.7 % (ref 3.0–12.0)
Neutro Abs: 4.4 10*3/uL (ref 1.4–7.7)
Neutrophils Relative %: 69.4 % (ref 43.0–77.0)
Platelets: 227 10*3/uL (ref 150.0–400.0)
RBC: 4.86 Mil/uL (ref 4.22–5.81)
RDW: 12.1 % (ref 11.5–15.5)
WBC: 6.3 10*3/uL (ref 4.0–10.5)

## 2020-12-05 LAB — T4, FREE: Free T4: 0.83 ng/dL (ref 0.60–1.60)

## 2020-12-05 LAB — HEPATIC FUNCTION PANEL
ALT: 15 U/L (ref 0–53)
AST: 14 U/L (ref 0–37)
Albumin: 4.3 g/dL (ref 3.5–5.2)
Alkaline Phosphatase: 70 U/L (ref 39–117)
Bilirubin, Direct: 0.2 mg/dL (ref 0.0–0.3)
Total Bilirubin: 1 mg/dL (ref 0.2–1.2)
Total Protein: 6.8 g/dL (ref 6.0–8.3)

## 2020-12-05 LAB — LIPID PANEL
Cholesterol: 156 mg/dL (ref 0–200)
HDL: 59.8 mg/dL (ref >39.00)
LDL Cholesterol: 84 mg/dL (ref 0–99)
NonHDL: 95.89
Total CHOL/HDL Ratio: 3
Triglycerides: 60 mg/dL (ref 0.0–149.0)
VLDL: 12 mg/dL (ref 0.0–40.0)

## 2020-12-05 LAB — BASIC METABOLIC PANEL
BUN: 14 mg/dL (ref 6–23)
CO2: 29 mEq/L (ref 19–32)
Calcium: 9.2 mg/dL (ref 8.4–10.5)
Chloride: 104 mEq/L (ref 96–112)
Creatinine, Ser: 0.84 mg/dL (ref 0.40–1.50)
GFR: 101.07 mL/min (ref >60.00)
Glucose, Bld: 90 mg/dL (ref 70–99)
Potassium: 4.3 mEq/L (ref 3.5–5.1)
Sodium: 139 mEq/L (ref 135–145)

## 2020-12-05 LAB — HEMOGLOBIN A1C: Hgb A1c MFr Bld: 5.1 % (ref 4.6–6.5)

## 2020-12-05 LAB — TSH: TSH: 1.14 u[IU]/mL (ref 0.35–4.50)

## 2020-12-05 LAB — PSA: PSA: 0.35 ng/mL (ref 0.10–4.00)

## 2020-12-05 LAB — T3, FREE: T3, Free: 3 pg/mL (ref 2.3–4.2)

## 2020-12-05 MED ORDER — TRIAMCINOLONE ACETONIDE 0.1 % EX CREA: 1.0000 "application " | TOPICAL_CREAM | Freq: Two times a day (BID) | CUTANEOUS | 5 refills | Status: DC

## 2020-12-05 NOTE — Progress Notes (Signed)
   Subjective:    Patient ID: Trevor Hernandez, male    DOB: 02-17-1971, 50 y.o.   MRN: [id number]  HPI Here for a well exam. He feels great.    Review of Systems  Constitutional: Negative.   HENT: Negative.   Eyes: Negative.   Respiratory: Negative.   Cardiovascular: Negative.   Gastrointestinal: Negative.   Genitourinary: Negative.   Musculoskeletal: Negative.   Skin: Negative.   Neurological: Negative.   Psychiatric/Behavioral: Negative.        Objective:   Physical Exam Constitutional:      General: He is not in acute distress.    Appearance: He is well-developed. He is not diaphoretic.  HENT:     Head: Normocephalic and atraumatic.     Right Ear: External ear normal.     Left Ear: External ear normal.     Nose: Nose normal.     Mouth/Throat:     Pharynx: No oropharyngeal exudate.  Eyes:     General: No scleral icterus.       Right eye: No discharge.        Left eye: No discharge.     Conjunctiva/sclera: Conjunctivae normal.     Pupils: Pupils are equal, round, and reactive to light.  Neck:     Thyroid: No thyromegaly.     Vascular: No JVD.     Trachea: No tracheal deviation.  Cardiovascular:     Rate and Rhythm: Normal rate and regular rhythm.     Heart sounds: Normal heart sounds. No murmur heard. No friction rub. No gallop.   Pulmonary:     Effort: Pulmonary effort is normal. No respiratory distress.     Breath sounds: Normal breath sounds. No wheezing or rales.  Chest:     Chest wall: No tenderness.  Abdominal:     General: Bowel sounds are normal. There is no distension.     Palpations: Abdomen is soft. There is no mass.     Tenderness: There is no abdominal tenderness. There is no guarding or rebound.  Genitourinary:    Penis: Normal. No tenderness.      Testes: Normal.     Prostate: Normal.     Rectum: Normal. Guaiac result negative.  Musculoskeletal:        General: No tenderness. Normal range of motion.     Cervical back: Neck supple.   Lymphadenopathy:     Cervical: No cervical adenopathy.  Skin:    General: Skin is warm and dry.     Coloration: Skin is not pale.     Findings: No erythema or rash.  Neurological:     Mental Status: He is alert and oriented to person, place, and time.     Cranial Nerves: No cranial nerve deficit.     Motor: No abnormal muscle tone.     Coordination: Coordination normal.     Deep Tendon Reflexes: Reflexes are normal and symmetric. Reflexes normal.  Psychiatric:        Behavior: Behavior normal.        Thought Content: Thought content normal.        Judgment: Judgment normal.           Assessment & Plan:  Well exam. We discussed diet and exercise. Get fasting labs.  Alysia Penna, MD

## 2020-12-05 NOTE — Addendum Note (Signed)
Addended by: Elmer Picker on: 12/05/2020 08:41 AM   Modules accepted: Orders

## 2021-05-20 ENCOUNTER — Telehealth (INDEPENDENT_AMBULATORY_CARE_PROVIDER_SITE_OTHER): Payer: BC Managed Care – PPO | Admitting: Family Medicine

## 2021-05-20 ENCOUNTER — Encounter: Payer: Self-pay | Admitting: Family Medicine

## 2021-05-20 DIAGNOSIS — U071 COVID-19: Secondary | ICD-10-CM | POA: Diagnosis not present

## 2021-05-20 MED ORDER — NIRMATRELVIR/RITONAVIR (PAXLOVID)TABLET: 3.0000 | ORAL_TABLET | Freq: Two times a day (BID) | ORAL | 0 refills | Status: AC

## 2021-05-20 NOTE — Progress Notes (Signed)
Subjective:    Patient ID: Trevor Hernandez, male    DOB: [birth year], 50 y.o.   MRN: [id number]  HPI Virtual Visit via Video Note  I connected with the patient on 05/20/21 at  3:45 PM EDT by a video enabled telemedicine application and verified that I am speaking with the correct person using two identifiers.  Location patient: home Location provider:work or home office Persons participating in the virtual visit: patient, provider  I discussed the limitations of evaluation and management by telemedicine and the availability of in person appointments. The patient expressed understanding and agreed to proceed.   HPI: Here for a Covid-19 infection. About 3 days ago he developed body aches, chills, ST, a dry cough, and fatigue. No SOB or NVD. He tested positive for the Covid virus this morning. He is quarantined at home and staying away from his family as much as possible. Drinking fluids and taking Advil.    ROS: See pertinent positives and negatives per HPI.  Past Medical History:  Diagnosis Date   Allergy    seasonal   Epididymitis    sees Dr. Rolan Bucco    Hypoglycemia    sees Dr. Cruzita Lederer     Past Surgical History:  Procedure Laterality Date   COLONOSCOPY  08/31/2019   per Dr. Fuller Plan, internal hemorrhoid, no polyps, repeat in 10 yrs     Family History  Problem Relation Age of Onset   Diabetes Father    Colon polyps Father    Colon cancer Neg Hx    Esophageal cancer Neg Hx    Rectal cancer Neg Hx    Stomach cancer Neg Hx      Current Outpatient Medications:    fexofenadine (ALLEGRA) 180 MG tablet, Take 180 mg by mouth daily as needed for allergies or rhinitis., Disp: , Rfl:    triamcinolone cream (KENALOG) 0.1 %, Apply 1 application topically 2 (two) times daily., Disp: 45 g, Rfl: 5   nirmatrelvir/ritonavir EUA (PAXLOVID) 20 x 150 MG & 10 x 100MG  TABS, Take 3 tablets by mouth 2 (two) times daily for 5 days. (Take nirmatrelvir 150 mg two tablets twice daily for  5 days and ritonavir 100 mg one tablet twice daily for 5 days) Patient GFR is 101, Disp: 30 tablet, Rfl: 0  EXAM:  VITALS per patient if applicable:  GENERAL: alert, oriented, appears well and in no acute distress  HEENT: atraumatic, conjunttiva clear, no obvious abnormalities on inspection of external nose and ears  NECK: normal movements of the head and neck  LUNGS: on inspection no signs of respiratory distress, breathing rate appears normal, no obvious gross SOB, gasping or wheezing  CV: no obvious cyanosis  MS: moves all visible extremities without noticeable abnormality  PSYCH/NEURO: pleasant and cooperative, no obvious depression or anxiety, speech and thought processing grossly intact  ASSESSMENT AND PLAN: Covid-19 infection . Treat with 5 days of Paxlovid. Alysia Penna, MD  Discussed the following assessment and plan:  No diagnosis found.     I discussed the assessment and treatment plan with the patient. The patient was provided an opportunity to ask questions and all were answered. The patient agreed with the plan and demonstrated an understanding of the instructions.   The patient was advised to call back or seek an in-person evaluation if the symptoms worsen or if the condition fails to improve as anticipated.      Review of Systems     Objective:   Physical Exam  Assessment & Plan:

## 2021-08-16 ENCOUNTER — Ambulatory Visit: Payer: BC Managed Care – PPO | Admitting: Family Medicine

## 2021-08-16 ENCOUNTER — Encounter: Payer: Self-pay | Admitting: Family Medicine

## 2021-08-16 VITALS — BP 102/70 | HR 71 | Temp 98.8°F | Wt 158.4 lb

## 2021-08-16 DIAGNOSIS — D1724 Benign lipomatous neoplasm of skin and subcutaneous tissue of left leg: Secondary | ICD-10-CM

## 2021-08-16 NOTE — Progress Notes (Signed)
° °  Subjective:    Patient ID: Trevor Hernandez, male    DOB: 1970-11-17, 51 y.o.   MRN: [id number]  HPI Here to check a lump he found on the left thigh about 6 months ago. It has not changed at all and it does not bother him.    Review of Systems  Constitutional: Negative.   Respiratory: Negative.    Cardiovascular: Negative.       Objective:   Physical Exam Constitutional:      Appearance: Normal appearance.  Cardiovascular:     Rate and Rhythm: Normal rate and regular rhythm.     Pulses: Normal pulses.     Heart sounds: Normal heart sounds.  Pulmonary:     Effort: Pulmonary effort is normal.     Breath sounds: Normal breath sounds.  Skin:    Comments: The anterior left thigh has a <1 cm firm mobile non-tender lump just under the skin  Neurological:     Mental Status: He is alert.          Assessment & Plan:  This is a lipoma. We discussed the benign nature of this lesion. He will keep an eye on it and follow up as needed.  Alysia Penna, MD

## 2021-12-18 ENCOUNTER — Encounter: Payer: Self-pay | Admitting: Family Medicine

## 2021-12-18 ENCOUNTER — Ambulatory Visit (INDEPENDENT_AMBULATORY_CARE_PROVIDER_SITE_OTHER): Payer: BC Managed Care – PPO | Admitting: Family Medicine

## 2021-12-18 VITALS — BP 104/70 | HR 70 | Temp 98.3°F | Ht 68.25 in | Wt 155.0 lb

## 2021-12-18 DIAGNOSIS — Z0000 Encounter for general adult medical examination without abnormal findings: Secondary | ICD-10-CM

## 2021-12-18 DIAGNOSIS — Z125 Encounter for screening for malignant neoplasm of prostate: Secondary | ICD-10-CM | POA: Diagnosis not present

## 2021-12-18 LAB — HEPATIC FUNCTION PANEL
ALT: 13 U/L (ref 0–53)
AST: 13 U/L (ref 0–37)
Albumin: 4.4 g/dL (ref 3.5–5.2)
Alkaline Phosphatase: 69 U/L (ref 39–117)
Bilirubin, Direct: 0.2 mg/dL (ref 0.0–0.3)
Total Bilirubin: 1 mg/dL (ref 0.2–1.2)
Total Protein: 6.9 g/dL (ref 6.0–8.3)

## 2021-12-18 LAB — CBC WITH DIFFERENTIAL/PLATELET
Basophils Absolute: 0 10*3/uL (ref 0.0–0.1)
Basophils Relative: 0.6 % (ref 0.0–3.0)
Eosinophils Absolute: 0.1 10*3/uL (ref 0.0–0.7)
Eosinophils Relative: 0.8 % (ref 0.0–5.0)
HCT: 46.2 % (ref 39.0–52.0)
Hemoglobin: 16.1 g/dL (ref 13.0–17.0)
Lymphocytes Relative: 17.3 % (ref 12.0–46.0)
Lymphs Abs: 1.2 10*3/uL (ref 0.7–4.0)
MCHC: 34.8 g/dL (ref 30.0–36.0)
MCV: 92.1 fl (ref 78.0–100.0)
Monocytes Absolute: 0.5 10*3/uL (ref 0.1–1.0)
Monocytes Relative: 7.8 % (ref 3.0–12.0)
Neutro Abs: 5 10*3/uL (ref 1.4–7.7)
Neutrophils Relative %: 73.5 % (ref 43.0–77.0)
Platelets: 229 10*3/uL (ref 150.0–400.0)
RBC: 5.02 Mil/uL (ref 4.22–5.81)
RDW: 12.4 % (ref 11.5–15.5)
WBC: 6.8 10*3/uL (ref 4.0–10.5)

## 2021-12-18 LAB — LIPID PANEL
Cholesterol: 156 mg/dL (ref 0–200)
HDL: 59.8 mg/dL (ref >39.00)
LDL Cholesterol: 82 mg/dL (ref 0–99)
NonHDL: 96.63
Total CHOL/HDL Ratio: 3
Triglycerides: 75 mg/dL (ref 0.0–149.0)
VLDL: 15 mg/dL (ref 0.0–40.0)

## 2021-12-18 LAB — BASIC METABOLIC PANEL
BUN: 14 mg/dL (ref 6–23)
CO2: 29 mEq/L (ref 19–32)
Calcium: 9.3 mg/dL (ref 8.4–10.5)
Chloride: 102 mEq/L (ref 96–112)
Creatinine, Ser: 0.9 mg/dL (ref 0.40–1.50)
GFR: 98.27 mL/min (ref >60.00)
Glucose, Bld: 85 mg/dL (ref 70–99)
Potassium: 4.1 mEq/L (ref 3.5–5.1)
Sodium: 139 mEq/L (ref 135–145)

## 2021-12-18 LAB — PSA: PSA: 0.31 ng/mL (ref 0.10–4.00)

## 2021-12-18 LAB — TSH: TSH: 1.17 u[IU]/mL (ref 0.35–5.50)

## 2021-12-18 LAB — HEMOGLOBIN A1C: Hgb A1c MFr Bld: 5 % (ref 4.6–6.5)

## 2021-12-18 NOTE — Progress Notes (Signed)
? ?  Subjective:  ? ? Patient ID: Ramere Downs, male    DOB: 03/27/1971, 51 y.o.   MRN: [id number] ? ?HPI ?Here for a well exam. He feels great.  ? ? ?Review of Systems  ?Constitutional: Negative.   ?HENT: Negative.    ?Eyes: Negative.   ?Respiratory: Negative.    ?Cardiovascular: Negative.   ?Gastrointestinal: Negative.   ?Genitourinary: Negative.   ?Musculoskeletal: Negative.   ?Skin: Negative.   ?Neurological: Negative.   ?Psychiatric/Behavioral: Negative.    ? ?   ?Objective:  ? Physical Exam ?Constitutional:   ?   General: He is not in acute distress. ?   Appearance: Normal appearance. He is well-developed. He is not diaphoretic.  ?HENT:  ?   Head: Normocephalic and atraumatic.  ?   Right Ear: External ear normal.  ?   Left Ear: External ear normal.  ?   Nose: Nose normal.  ?   Mouth/Throat:  ?   Pharynx: No oropharyngeal exudate.  ?Eyes:  ?   General: No scleral icterus.    ?   Right eye: No discharge.     ?   Left eye: No discharge.  ?   Conjunctiva/sclera: Conjunctivae normal.  ?   Pupils: Pupils are equal, round, and reactive to light.  ?Neck:  ?   Thyroid: No thyromegaly.  ?   Vascular: No JVD.  ?   Trachea: No tracheal deviation.  ?Cardiovascular:  ?   Rate and Rhythm: Normal rate and regular rhythm.  ?   Heart sounds: Normal heart sounds. No murmur heard. ?  No friction rub. No gallop.  ?Pulmonary:  ?   Effort: Pulmonary effort is normal. No respiratory distress.  ?   Breath sounds: Normal breath sounds. No wheezing or rales.  ?Chest:  ?   Chest wall: No tenderness.  ?Abdominal:  ?   General: Bowel sounds are normal. There is no distension.  ?   Palpations: Abdomen is soft. There is no mass.  ?   Tenderness: There is no abdominal tenderness. There is no guarding or rebound.  ?Genitourinary: ?   Penis: Normal. No tenderness.   ?   Testes: Normal.  ?   Prostate: Normal.  ?   Rectum: Normal. Guaiac result negative.  ?Musculoskeletal:     ?   General: No tenderness. Normal range of motion.  ?   Cervical  back: Neck supple.  ?Lymphadenopathy:  ?   Cervical: No cervical adenopathy.  ?Skin: ?   General: Skin is warm and dry.  ?   Coloration: Skin is not pale.  ?   Findings: No erythema or rash.  ?Neurological:  ?   Mental Status: He is alert and oriented to person, place, and time.  ?   Cranial Nerves: No cranial nerve deficit.  ?   Motor: No abnormal muscle tone.  ?   Coordination: Coordination normal.  ?   Deep Tendon Reflexes: Reflexes are normal and symmetric. Reflexes normal.  ?Psychiatric:     ?   Behavior: Behavior normal.     ?   Thought Content: Thought content normal.     ?   Judgment: Judgment normal.  ? ? ? ? ? ?   ?Assessment & Plan:  ?Well exam. We discussed diet and exercise. Get fasting labs.  ?Alysia Penna, MD ? ? ?

## 2022-02-19 ENCOUNTER — Encounter: Payer: Self-pay | Admitting: Family Medicine

## 2022-02-19 ENCOUNTER — Ambulatory Visit: Payer: BC Managed Care – PPO | Admitting: Family Medicine

## 2022-02-19 VITALS — BP 98/70 | HR 60 | Temp 98.4°F | Wt 153.1 lb

## 2022-02-19 DIAGNOSIS — H1032 Unspecified acute conjunctivitis, left eye: Secondary | ICD-10-CM | POA: Diagnosis not present

## 2022-02-19 MED ORDER — TOBRAMYCIN-DEXAMETHASONE 0.3-0.1 % OP SUSP: 2.0000 [drp] | OPHTHALMIC | 0 refills | Status: DC

## 2022-02-19 NOTE — Progress Notes (Signed)
   Subjective:    Patient ID: Trevor Hernandez, male    DOB: 09/02/1970, 51 y.o.   MRN: [id number]  HPI Here for 2 days of redness in the left eye with itching, burning, and scant mucus. No other symptoms. Bright lights do not cause pain. Vision is normal.    Review of Systems  Constitutional: Negative.   HENT:  Negative for congestion, ear pain, facial swelling, postnasal drip and sore throat.   Eyes:  Positive for discharge, redness and itching. Negative for photophobia, pain and visual disturbance.  Respiratory: Negative.         Objective:   Physical Exam Constitutional:      Appearance: Normal appearance.  HENT:     Right Ear: Tympanic membrane, ear canal and external ear normal.     Left Ear: Tympanic membrane, ear canal and external ear normal.     Nose: Nose normal.     Mouth/Throat:     Pharynx: Oropharynx is clear.  Eyes:     Pupils: Pupils are equal, round, and reactive to light.     Comments: Right eye is clear. Left eye is pink with some hypervascularity.   Neurological:     Mental Status: He is alert.           Assessment & Plan:  Conjunctivitis, treat with Tobradex drops.  Alysia Penna, MD

## 2022-05-13 ENCOUNTER — Encounter: Payer: Self-pay | Admitting: Family Medicine

## 2022-05-13 ENCOUNTER — Ambulatory Visit: Payer: BC Managed Care – PPO | Admitting: Family Medicine

## 2022-05-13 VITALS — BP 98/70 | HR 65 | Temp 98.3°F | Wt 153.1 lb

## 2022-05-13 DIAGNOSIS — H5789 Other specified disorders of eye and adnexa: Secondary | ICD-10-CM | POA: Diagnosis not present

## 2022-05-13 NOTE — Progress Notes (Signed)
   Subjective:    Patient ID: Trevor Hernandez, male    DOB: January 24, 1971, 51 y.o.   MRN: [id number]  HPI Here with recurrent problem sin the left eye. We saw him on 02-19-22 for this and he used Tobradex drops for a week. He then felt better for a month until the same symptoms started back in the left eye several weeks ago. He sometimes feels a burning sensation in the eye but not eyeball pain. He may have a small amount of DC off and on. Sometimes his vision gets blurry. Sometimes he becomes very sensitive to bright lights in the left eye.    Review of Systems  Constitutional: Negative.   HENT:  Negative for congestion, ear pain, postnasal drip, rhinorrhea, sinus pain and sore throat.   Eyes:  Positive for photophobia, discharge, redness and visual disturbance. Negative for itching.  Respiratory: Negative.         Objective:   Physical Exam Constitutional:      General: He is not in acute distress.    Appearance: Normal appearance.  HENT:     Right Ear: Tympanic membrane, ear canal and external ear normal.     Left Ear: Tympanic membrane, ear canal and external ear normal.     Nose: Nose normal.     Mouth/Throat:     Pharynx: Oropharynx is clear.  Eyes:     Extraocular Movements: Extraocular movements intact.     Pupils: Pupils are equal, round, and reactive to light.     Comments: The left eye is clear. The right eye is red with no visible DC. He is slightly photophobic.   Pulmonary:     Effort: Pulmonary effort is normal.     Breath sounds: Normal breath sounds.  Lymphadenopathy:     Cervical: No cervical adenopathy.  Neurological:     Mental Status: He is alert.           Assessment & Plan:  Recurring left eye issues which could be due to something like iritis or glaucoma. He will use Refresh drops for comfort. We will refer him to Ophthalmology asap.  Alysia Penna, MD

## 2022-06-23 ENCOUNTER — Other Ambulatory Visit: Payer: Self-pay

## 2022-06-23 ENCOUNTER — Encounter: Payer: Self-pay | Admitting: Family Medicine

## 2022-06-23 MED ORDER — TRIAMCINOLONE ACETONIDE 0.1 % EX CREA: 1.0000 | TOPICAL_CREAM | Freq: Two times a day (BID) | CUTANEOUS | 5 refills | Status: DC

## 2023-02-03 ENCOUNTER — Encounter: Payer: Self-pay | Admitting: Family Medicine

## 2023-02-03 ENCOUNTER — Ambulatory Visit (INDEPENDENT_AMBULATORY_CARE_PROVIDER_SITE_OTHER): Payer: BC Managed Care – PPO | Admitting: Family Medicine

## 2023-02-03 VITALS — BP 106/70 | HR 64 | Temp 98.2°F | Ht 68.0 in | Wt 156.8 lb

## 2023-02-03 DIAGNOSIS — L409 Psoriasis, unspecified: Secondary | ICD-10-CM

## 2023-02-03 DIAGNOSIS — Z0000 Encounter for general adult medical examination without abnormal findings: Secondary | ICD-10-CM

## 2023-02-03 LAB — HEPATIC FUNCTION PANEL
ALT: 12 U/L (ref 0–53)
AST: 15 U/L (ref 0–37)
Albumin: 4.2 g/dL (ref 3.5–5.2)
Alkaline Phosphatase: 62 U/L (ref 39–117)
Bilirubin, Direct: 0.2 mg/dL (ref 0.0–0.3)
Total Bilirubin: 0.9 mg/dL (ref 0.2–1.2)
Total Protein: 6.8 g/dL (ref 6.0–8.3)

## 2023-02-03 LAB — CBC WITH DIFFERENTIAL/PLATELET
Basophils Absolute: 0 10*3/uL (ref 0.0–0.1)
Basophils Relative: 0.5 % (ref 0.0–3.0)
Eosinophils Absolute: 0.1 10*3/uL (ref 0.0–0.7)
Eosinophils Relative: 1.7 % (ref 0.0–5.0)
HCT: 46.3 % (ref 39.0–52.0)
Hemoglobin: 16 g/dL (ref 13.0–17.0)
Lymphocytes Relative: 24.9 % (ref 12.0–46.0)
Lymphs Abs: 1.6 10*3/uL (ref 0.7–4.0)
MCHC: 34.5 g/dL (ref 30.0–36.0)
MCV: 93.2 fl (ref 78.0–100.0)
Monocytes Absolute: 0.5 10*3/uL (ref 0.1–1.0)
Monocytes Relative: 8.3 % (ref 3.0–12.0)
Neutro Abs: 4 10*3/uL (ref 1.4–7.7)
Neutrophils Relative %: 64.6 % (ref 43.0–77.0)
Platelets: 225 10*3/uL (ref 150.0–400.0)
RBC: 4.97 Mil/uL (ref 4.22–5.81)
RDW: 12.5 % (ref 11.5–15.5)
WBC: 6.2 10*3/uL (ref 4.0–10.5)

## 2023-02-03 LAB — BASIC METABOLIC PANEL
BUN: 12 mg/dL (ref 6–23)
CO2: 28 mEq/L (ref 19–32)
Calcium: 9.3 mg/dL (ref 8.4–10.5)
Chloride: 105 mEq/L (ref 96–112)
Creatinine, Ser: 0.84 mg/dL (ref 0.40–1.50)
GFR: 99.55 mL/min (ref >60.00)
Glucose, Bld: 86 mg/dL (ref 70–99)
Potassium: 4.5 mEq/L (ref 3.5–5.1)
Sodium: 141 mEq/L (ref 135–145)

## 2023-02-03 LAB — LIPID PANEL
Cholesterol: 144 mg/dL (ref 0–200)
HDL: 55.3 mg/dL (ref >39.00)
LDL Cholesterol: 76 mg/dL (ref 0–99)
NonHDL: 88.6
Total CHOL/HDL Ratio: 3
Triglycerides: 64 mg/dL (ref 0.0–149.0)
VLDL: 12.8 mg/dL (ref 0.0–40.0)

## 2023-02-03 LAB — HEMOGLOBIN A1C: Hgb A1c MFr Bld: 5.1 % (ref 4.6–6.5)

## 2023-02-03 LAB — PSA: PSA: 0.34 ng/mL (ref 0.10–4.00)

## 2023-02-03 LAB — TSH: TSH: 1.22 u[IU]/mL (ref 0.35–5.50)

## 2023-02-03 MED ORDER — TRIAMCINOLONE ACETONIDE 0.1 % EX CREA: 1.0000 | TOPICAL_CREAM | Freq: Two times a day (BID) | CUTANEOUS | 3 refills | Status: AC

## 2023-02-03 MED ORDER — TERBINAFINE HCL 250 MG PO TABS: 250.0000 mg | ORAL_TABLET | Freq: Every day | ORAL | 1 refills | Status: DC

## 2023-02-03 NOTE — Progress Notes (Signed)
Subjective:    Patient ID: Trevor Hernandez, male    DOB: 07-Oct-1970, 52 y.o.   MRN: [id number]  HPI Here for a well exam. He feels well in general. His psoriasis is well controlled with the Triamcinolone cream. He does ask [place] to check his toenails. Some of them are turning a yellow color.    Review of Systems  Constitutional: Negative.   HENT: Negative.    Eyes: Negative.   Respiratory: Negative.    Cardiovascular: Negative.   Gastrointestinal: Negative.   Genitourinary: Negative.   Musculoskeletal: Negative.   Skin: Negative.   Neurological: Negative.   Psychiatric/Behavioral: Negative.         Objective:   Physical Exam Constitutional:      General: He is not in acute distress.    Appearance: Normal appearance. He is well-developed. He is not diaphoretic.  HENT:     Head: Normocephalic and atraumatic.     Right Ear: External ear normal.     Left Ear: External ear normal.     Nose: Nose normal.     Mouth/Throat:     Pharynx: No oropharyngeal exudate.  Eyes:     General: No scleral icterus.       Right eye: No discharge.        Left eye: No discharge.     Conjunctiva/sclera: Conjunctivae normal.     Pupils: Pupils are equal, round, and reactive to light.  Neck:     Thyroid: No thyromegaly.     Vascular: No JVD.     Trachea: No tracheal deviation.  Cardiovascular:     Rate and Rhythm: Normal rate and regular rhythm.     Pulses: Normal pulses.     Heart sounds: Normal heart sounds. No murmur heard.    No friction rub. No gallop.  Pulmonary:     Effort: Pulmonary effort is normal. No respiratory distress.     Breath sounds: Normal breath sounds. No wheezing or rales.  Chest:     Chest wall: No tenderness.  Abdominal:     General: Abdomen is flat. Bowel sounds are normal. There is no distension.     Palpations: Abdomen is soft. There is no mass.     Tenderness: There is no abdominal tenderness. There is no guarding or rebound.  Genitourinary:    Penis:  Normal. No tenderness.      Testes: Normal.     Prostate: Normal.     Rectum: Normal. Guaiac result negative.  Musculoskeletal:        General: No tenderness. Normal range of motion.     Cervical back: Neck supple.  Lymphadenopathy:     Cervical: No cervical adenopathy.  Skin:    General: Skin is warm and dry.     Coloration: Skin is not pale.     Findings: No erythema or rash.     Comments: Most of his toenails have a yellow color and are thickened   Neurological:     General: No focal deficit present.     Mental Status: He is alert and oriented to person, place, and time.     Cranial Nerves: No cranial nerve deficit.     Motor: No abnormal muscle tone.     Coordination: Coordination normal.     Deep Tendon Reflexes: Reflexes are normal and symmetric. Reflexes normal.  Psychiatric:        Mood and Affect: Mood normal.        Behavior: Behavior normal.  Thought Content: Thought content normal.        Judgment: Judgment normal.           Assessment & Plan:  Well exam. We discussed diet and exercise. Get fasting labs. He has some onychomycosis, so we will treat this with daily Terbinafine.  Gershon Crane, MD

## 2023-05-20 ENCOUNTER — Encounter: Payer: Self-pay | Admitting: Family Medicine

## 2023-05-20 ENCOUNTER — Ambulatory Visit: Payer: BC Managed Care – PPO | Admitting: Family Medicine

## 2023-05-20 VITALS — BP 108/70 | HR 63 | Temp 98.2°F | Wt 152.9 lb

## 2023-05-20 DIAGNOSIS — R1012 Left upper quadrant pain: Secondary | ICD-10-CM | POA: Diagnosis not present

## 2023-05-20 MED ORDER — OMEPRAZOLE 40 MG PO CPDR
40.0000 mg | DELAYED_RELEASE_CAPSULE | Freq: Every day | ORAL | 3 refills | Status: DC
Start: 2023-05-20 — End: 2024-02-04

## 2023-05-20 NOTE — Progress Notes (Signed)
   Subjective:    Patient ID: Trevor Hernandez, male    DOB: 04-23-1971, 52 y.o.   MRN: [id number]  HPI Here for 2 weeks of intermittent mild pain in the LUQ as well as excess belching. No nausea or fever. His appetite is good. His BM's are normal. He found that drinking coffee makes the pain worse. He had a normal colonoscopy in 2021.    Review of Systems  Constitutional: Negative.   Respiratory: Negative.    Cardiovascular: Negative.   Gastrointestinal:  Positive for abdominal pain. Negative for abdominal distention, blood in stool, constipation, diarrhea, nausea and vomiting.       Objective:   Physical Exam Constitutional:      Appearance: Normal appearance. He is not ill-appearing.  Cardiovascular:     Rate and Rhythm: Normal rate and regular rhythm.     Pulses: Normal pulses.     Heart sounds: Normal heart sounds.  Pulmonary:     Effort: Pulmonary effort is normal.     Breath sounds: Normal breath sounds.  Abdominal:     General: Abdomen is flat. Bowel sounds are normal. There is no distension.     Palpations: Abdomen is soft. There is no mass.     Tenderness: There is no abdominal tenderness. There is no right CVA tenderness, left CVA tenderness, guarding or rebound.     Hernia: No hernia is present.  Neurological:     Mental Status: He is alert.           Assessment & Plan:  LUQ pain consistent with gastritis. He will try Omeprazole 40 mg every morning. Report back in one week.  Gershon Crane, MD

## 2023-05-26 ENCOUNTER — Encounter: Payer: Self-pay | Admitting: Family Medicine

## 2023-05-26 ENCOUNTER — Ambulatory Visit: Payer: BC Managed Care – PPO | Admitting: Family Medicine

## 2023-05-26 VITALS — BP 98/74 | HR 65 | Temp 98.0°F | Wt 153.6 lb

## 2023-05-26 DIAGNOSIS — R1012 Left upper quadrant pain: Secondary | ICD-10-CM

## 2023-05-26 NOTE — Progress Notes (Signed)
   Subjective:    Patient ID: Trevor Hernandez, male    DOB: Apr 13, 1971, 52 y.o.   MRN: [id number]  HPI Here to follow up on 3-4 weeks of constant LUQ abdominal pain that waxes and wanes, as well as a lot of belching. No heartburn or trouble swallowing, no fever or nausea. His BM's are regular. For the past week he has been taking Omeprazole every morning, and he has adjusted his  diet to avoid fatty foods and acidic foods. These measures have helped a little, but the problem remains. Sometimes the pain radiates through to the back.    Review of Systems  Constitutional: Negative.   Respiratory: Negative.    Cardiovascular: Negative.   Gastrointestinal:  Positive for abdominal pain. Negative for blood in stool, constipation, diarrhea, nausea and vomiting.  Genitourinary: Negative.        Objective:   Physical Exam Constitutional:      Appearance: Normal appearance. He is not ill-appearing.  Cardiovascular:     Rate and Rhythm: Normal rate and regular rhythm.     Pulses: Normal pulses.     Heart sounds: Normal heart sounds.  Pulmonary:     Effort: Pulmonary effort is normal.     Breath sounds: Normal breath sounds.  Abdominal:     General: Abdomen is flat. Bowel sounds are normal. There is no distension.     Palpations: Abdomen is soft. There is no mass.     Tenderness: There is no abdominal tenderness. There is no right CVA tenderness, left CVA tenderness, guarding or rebound.     Hernia: No hernia is present.  Neurological:     Mental Status: He is alert.           Assessment & Plan:  LUQ pain. We will set up an abdominal US. He will continue to take Omeprazole. We will also refer him to GI to consider a possible upper endoscopy. His symptoms are consistent with a hiatal hernia.  Gershon Crane, MD

## 2023-05-27 ENCOUNTER — Ambulatory Visit
Admission: RE | Admit: 2023-05-27 | Discharge: 2023-05-27 | Disposition: A | Discharge status: Home / Self Care | Payer: BC Managed Care – PPO | Source: Ambulatory Visit | Attending: Family Medicine | Admitting: Family Medicine

## 2023-05-27 DIAGNOSIS — R1012 Left upper quadrant pain: Secondary | ICD-10-CM

## 2023-06-08 ENCOUNTER — Ambulatory Visit: Payer: BC Managed Care – PPO | Admitting: Gastroenterology

## 2023-06-15 ENCOUNTER — Ambulatory Visit: Payer: BC Managed Care – PPO | Admitting: Family Medicine

## 2023-06-15 ENCOUNTER — Encounter: Payer: Self-pay | Admitting: Family Medicine

## 2023-06-15 VITALS — BP 100/74 | HR 67 | Temp 98.2°F | Wt 152.2 lb

## 2023-06-15 DIAGNOSIS — R1012 Left upper quadrant pain: Secondary | ICD-10-CM

## 2023-06-15 DIAGNOSIS — K56600 Partial intestinal obstruction, unspecified as to cause: Secondary | ICD-10-CM | POA: Diagnosis not present

## 2023-06-15 NOTE — Progress Notes (Signed)
   Subjective:    Patient ID: Trevor Hernandez, male    DOB: 1970-10-23, 52 y.o.   MRN: [id number]  HPI Here to follow up on LUQ pain. He has been taking Omeprazole 40 mg every morning, but this has not helped. The pain still waxes and wanes. His BM's are regular. Appetite is good. He finds that eating a bland diet and avoiding coffee and alcohol help quite a bit. Last weekend he had a little coffee and a little alcohol, and the pain and the belching got worse again. He had an abdominal US on 05-27-23 that was unremarkable.    Review of Systems  Constitutional: Negative.   Respiratory: Negative.    Cardiovascular: Negative.   Gastrointestinal:  Positive for abdominal pain. Negative for abdominal distention, blood in stool, constipation, diarrhea, nausea and vomiting.  Genitourinary: Negative.        Objective:   Physical Exam Constitutional:      Appearance: Normal appearance. He is not ill-appearing.  Cardiovascular:     Rate and Rhythm: Normal rate and regular rhythm.     Pulses: Normal pulses.     Heart sounds: Normal heart sounds.  Pulmonary:     Effort: Pulmonary effort is normal.     Breath sounds: Normal breath sounds.  Chest:     Chest wall: No tenderness.  Abdominal:     General: Abdomen is flat. Bowel sounds are normal. There is no distension.     Palpations: Abdomen is soft. There is no mass.     Tenderness: There is no abdominal tenderness. There is no guarding or rebound.     Hernia: No hernia is present.  Neurological:     Mental Status: He is alert.           Assessment & Plan:  LUQ pain. We will set up a contrasted CT of the abdomen and pelvis. He is scheduled to see [facility] on 08-07-23, but we will try to have someone see him sooner.  Gershon Crane, MD

## 2023-06-16 LAB — BASIC METABOLIC PANEL
BUN: 12 mg/dL (ref 6–23)
CO2: 29 meq/L (ref 19–32)
Calcium: 9 mg/dL (ref 8.4–10.5)
Chloride: 104 meq/L (ref 96–112)
Creatinine, Ser: 0.9 mg/dL (ref 0.40–1.50)
GFR: 97.25 mL/min (ref >60.00)
Glucose, Bld: 89 mg/dL (ref 70–99)
Potassium: 3.7 meq/L (ref 3.5–5.1)
Sodium: 141 meq/L (ref 135–145)

## 2023-06-19 ENCOUNTER — Ambulatory Visit
Admission: RE | Admit: 2023-06-19 | Discharge: 2023-06-19 | Disposition: A | Discharge status: Home / Self Care | Payer: BC Managed Care – PPO | Source: Ambulatory Visit | Attending: Family Medicine | Admitting: Family Medicine

## 2023-06-19 DIAGNOSIS — R1012 Left upper quadrant pain: Secondary | ICD-10-CM

## 2023-06-19 MED ORDER — IOPAMIDOL (ISOVUE-300) INJECTION 61%
100.0000 mL | Freq: Once | INTRAVENOUS | Status: AC | PRN
Start: 2023-06-19 — End: 2023-06-19
  Administered 2023-06-19: 100 mL via INTRAVENOUS

## 2023-06-30 ENCOUNTER — Encounter: Payer: Self-pay | Admitting: Family Medicine

## 2023-08-07 ENCOUNTER — Ambulatory Visit: Payer: 59 | Admitting: Nurse Practitioner

## 2023-08-07 ENCOUNTER — Encounter: Payer: Self-pay | Admitting: Nurse Practitioner

## 2023-08-07 VITALS — BP 96/74 | HR 94 | Ht 68.0 in | Wt 153.0 lb

## 2023-08-07 DIAGNOSIS — R1012 Left upper quadrant pain: Secondary | ICD-10-CM

## 2023-08-07 MED ORDER — OMEPRAZOLE 40 MG PO CPDR: 40.0000 mg | DELAYED_RELEASE_CAPSULE | Freq: Every day | ORAL | 1 refills | Status: DC

## 2023-08-07 NOTE — Patient Instructions (Addendum)
 You have been scheduled for an endoscopy. Please follow written instructions given to you at your visit today.  If you use inhalers (even only as needed), please bring them with you on the day of your procedure. ____________________________________________ We have sent the following medications to your pharmacy for you to pick up at your convenience: Omeprazole  40 mg  Gaviscon- take 1 tablespoon 3 times daily as needed (over the counter)   Due to recent changes in healthcare laws, you may see the results of your imaging and laboratory studies on MyChart before your provider has had a chance to review them.  We understand that in some cases there may be results that are confusing or concerning to you. Not all laboratory results come back in the same time frame and the provider may be waiting for multiple results in order to interpret others.  Please give us  48 hours in order for your provider to thoroughly review all the results before contacting the office for clarification of your results.   Thank you for trusting me with your gastrointestinal care!   Elida Shawl, CRNP

## 2023-08-07 NOTE — Progress Notes (Addendum)
 08/07/2023 Trevor Hernandez [id number] 10/26/1970   CHIEF COMPLAINT: LUQ pain   HISTORY OF PRESENT ILLNESS: Trevor Hernandez is a 53 year old male with a past medical history of hypoglycemia and psoriasis.  No past surgical history.  Previously known by Dr. Aneita.  He presents to our office today as referred by Dr. Garnette Olmsted for further evaluation regarding LUQ pain.  He described awakening abruptly at night 3 months ago with LUQ pain below the sternal border which was constant. He had mild nausea without vomiting.  He described his LUQ pain as searing, burning and poking accompanied with localized area of  indigestion with in his stomach.  He was seen by his PCP one week following the onset of his LUQ pain and he was prescribed Omeprazole  40 mg daily.  His intense indigestion cleared up after few weeks but his LUQ pain persisted.  He underwent an abdominal sonogram 05/27/2023 which showed a normal gallbladder without biliary ductal dilatation and a 1.2 cm calcification in the right liver lobe near the dome, likely a calcified granuloma otherwise was unremarkable.  CTAP with contrast 06/19/2023 showed a normal pancreas and moderate stool burden throughout the colon. He altered his diet by reducing his coffee intake and ate a bland diet and his LUQ pain improved but did not completely abate.  No dysphagia.  He passes a normal brown formed bowel movement daily.  No bloody or black stools.  He underwent a colonoscopy 08/31/2019 which showed internal hemorrhoids otherwise was normal.  He initially lost 5 pounds but subsequently regained back the lost weight.  He eats mostly a vegetarian diet with fish.  No NSAID use.  His newest medication was Lamisil  which she took for at least 3 months for toenail fungus.       Latest Ref Rng & Units 02/03/2023    8:27 AM 12/18/2021   10:19 AM 12/05/2020    8:41 AM  CBC  WBC 4.0 - 10.5 K/uL 6.2  6.8  6.3   Hemoglobin 13.0 - 17.0 g/dL 83.9  83.8  84.2   Hematocrit  39.0 - 52.0 % 46.3  46.2  44.4   Platelets 150.0 - 400.0 K/uL 225.0  229.0  227.0        Latest Ref Rng & Units 06/15/2023    3:07 PM 02/03/2023    8:27 AM 12/18/2021   10:19 AM  CMP  Glucose 70 - 99 mg/dL 89  86  85   BUN 6 - 23 mg/dL 12  12  14    Creatinine 0.40 - 1.50 mg/dL 9.09  9.15  9.09   Sodium 135 - 145 mEq/L 141  141  139   Potassium 3.5 - 5.1 mEq/L 3.7  4.5  4.1   Chloride 96 - 112 mEq/L 104  105  102   CO2 19 - 32 mEq/L 29  28  29    Calcium 8.4 - 10.5 mg/dL 9.0  9.3  9.3   Total Protein 6.0 - 8.3 g/dL  6.8  6.9   Total Bilirubin 0.2 - 1.2 mg/dL  0.9  1.0   Alkaline Phos 39 - 117 U/L  62  69   AST 0 - 37 U/L  15  13   ALT 0 - 53 U/L  12  13     CTAP with contrast 06/19/2023: FINDINGS: Lower chest: No acute abnormality.   Hepatobiliary: No focal hepatic abnormality. Gallbladder unremarkable.   Pancreas: No focal abnormality or ductal dilatation.   Spleen: No  focal abnormality.  Normal size.   Adrenals/Urinary Tract: No adrenal abnormality. No focal renal abnormality. No stones or hydronephrosis. Urinary bladder is unremarkable.   Stomach/Bowel: Moderate stool burden throughout the colon. Stomach, large and small bowel grossly unremarkable. No evidence of bowel obstruction. Normal appendix.   Vascular/Lymphatic: No evidence of aneurysm or adenopathy.   Reproductive: No visible focal abnormality.   Other: No free fluid or free air.   Musculoskeletal: No acute bony abnormality.   IMPRESSION: No acute findings in the abdomen or pelvis.   Moderate stool burden.    Colonoscopy 08/31/2019: - Internal hemorrhoids.  - The examination was otherwise normal on direct and retroflexion views.  - No specimens collected. - Recall colonoscopy 10 years   Past Medical History:  Diagnosis Date   Allergy    seasonal   Epididymitis    sees Dr. Toribio Repine    Hypoglycemia    sees Dr. Trixie    Psoriasis    sees Dr. Amy Jordan   Past Surgical History:   Procedure Laterality Date   COLONOSCOPY  08/31/2019   per Dr. Aneita, internal hemorrhoid, no polyps, repeat in 10 yrs    Social History: He is married.  He has 1 son and 1 daughter.  He is a [employer].  Nonsmoker. He drinks one beer 4 nights weekly. No drug use.   Family History: Father had skin cancer, colon polys, diabetes and heart disease. Mother had skin cancer.   Allergies  Allergen Reactions   Penicillins     Pt's wife is highly sensitive, so he would not want to take this.      Outpatient Encounter Medications as of 08/07/2023  Medication Sig   omeprazole  (PRILOSEC) 40 MG capsule Take 1 capsule (40 mg total) by mouth daily.   triamcinolone  cream (KENALOG ) 0.1 % Apply 1 Application topically 2 (two) times daily.   [DISCONTINUED] fexofenadine (ALLEGRA) 180 MG tablet Take 180 mg by mouth daily as needed for allergies or rhinitis.   [DISCONTINUED] terbinafine  (LAMISIL ) 250 MG tablet Take 1 tablet (250 mg total) by mouth daily. (Patient not taking: Reported on 08/07/2023)   No facility-administered encounter medications on file as of 08/07/2023.   REVIEW OF SYSTEMS:  Gen: Denies fever, sweats or chills. + Lost 5lbs, gained it back.  CV: Denies chest pain, palpitations or edema. Resp: Denies cough, shortness of breath of hemoptysis.  GI: Denies heartburn, dysphagia, stomach or lower abdominal pain. No diarrhea or constipation.  GU: Denies urinary burning, blood in urine, increased urinary frequency or incontinence. MS: Denies joint pain, muscles aches or weakness. Derm: Denies rash, itchiness, skin lesions or unhealing ulcers. Psych: Denies depression, anxiety, memory loss or confusion. Heme: Denies bruising, easy bleeding. Neuro:  Denies headaches, dizziness or paresthesias. Endo:  Denies any problems with DM, thyroid  or adrenal function.  PHYSICAL EXAM: BP 96/74   Pulse 94   Ht 5' 8 (1.727 m)   Wt 153 lb (69.4 kg)   SpO2 99%   BMI 23.26 kg/m   Wt  Readings from Last 3 Encounters:  08/07/23 153 lb (69.4 kg)  06/15/23 152 lb 3.2 oz (69 kg)  05/26/23 153 lb 9.6 oz (69.7 kg)    General: 53 year old male in no acute distress. Head: Normocephalic and atraumatic. Eyes:  Sclerae non-icteric, conjunctive pink. Ears: Normal auditory acuity. Mouth: Dentition intact. No ulcers or lesions.  Neck: Supple, no lymphadenopathy or thyromegaly.  Lungs: Clear bilaterally to auscultation without wheezes, crackles or rhonchi.  Heart: Regular rate and rhythm. No murmur, rub or gallop appreciated.  Abdomen: Soft, nontender, nondistended. No masses. No hepatosplenomegaly. Normoactive bowel sounds x 4 quadrants.  Rectal: Deferred.  Musculoskeletal: Symmetrical with no gross deformities. Skin: Warm and dry. No rash or lesions on visible extremities. Extremities: No edema. Neurological: Alert oriented x 4, no focal deficits.  Psychological:  Alert and cooperative. Normal mood and affect.  ASSESSMENT AND PLAN:  54 year old male with LUQ pain x 3 months. CTAP showed a normal pancreas and spleen with stool burden in the colon. -EGD to rule out H. Pylori gastritis/PUD and other UGI abnormality to explain his LUQ pain -Continue GERD diet -Continue Omeprazole  40mg  every day, may take twice daily if needed  -Gaviscon 1 tablespoon po tid PRN purchase otc  -Check CBC, CMP, and lipase level if LUQ pain worsens. Negative abdominal exam today.  -MiraLAX nightly to increase stool output as tolerated -Consider abdominal MRI with focus on the pancreas if EGD unrevealing -Further recommendations to be determined after EGD completed  Colon cancer screening. Normal colonoscopy 08/2019. -Next screening colonoscopy due 08/2029    CC:  Johnny Garnette LABOR, MD  I have reviewed the consultation note as outlined by Elida Shawl, NP for evaluation and management of left upper quadrant abdominal pain.  I agree with the assessment, plan and medical decision making.  Will  continue GERD and constipation management while awaiting results of EGD.  Inocente Hausen, MD

## 2023-08-17 ENCOUNTER — Ambulatory Visit: Payer: 59 | Admitting: Pediatrics

## 2023-08-17 ENCOUNTER — Encounter: Payer: Self-pay | Admitting: Pediatrics

## 2023-08-17 VITALS — BP 107/65 | HR 61 | Temp 98.4°F | Resp 9 | Ht 68.0 in | Wt 153.0 lb

## 2023-08-17 DIAGNOSIS — R1012 Left upper quadrant pain: Secondary | ICD-10-CM

## 2023-08-17 DIAGNOSIS — K3189 Other diseases of stomach and duodenum: Secondary | ICD-10-CM | POA: Diagnosis not present

## 2023-08-17 DIAGNOSIS — K317 Polyp of stomach and duodenum: Secondary | ICD-10-CM

## 2023-08-17 DIAGNOSIS — K30 Functional dyspepsia: Secondary | ICD-10-CM

## 2023-08-17 MED ORDER — SODIUM CHLORIDE 0.9 % IV SOLN: 500.0000 mL | Freq: Once | INTRAVENOUS | Status: DC

## 2023-08-17 NOTE — Progress Notes (Signed)
 [facility] History and Physical   Primary Care Physician:  Trevor Garnette LABOR, MD   Reason for Procedure:  Right upper quadrant abdominal pain, nausea, indigestion  Plan:    Diagnostic upper endoscopy     HPI: Trevor Hernandez is a 53 y.o. male undergoing diagnostic upper endoscopy for evaluation of left upper quadrant abdominal pain and nausea.  Abdominal pain described as a burning sensation with associated indigestion.  Abdominal ultrasound overall unremarkable.  No dysphagia.   Past Medical History:  Diagnosis Date   Allergy    seasonal   Epididymitis    sees Dr. Toribio Hernandez    Hypoglycemia    sees Dr. Trixie    Psoriasis    sees Trevor Hernandez    Past Surgical History:  Procedure Laterality Date   COLONOSCOPY  08/31/2019   per Dr. Aneita, internal hemorrhoid, no polyps, repeat in 10 yrs     Prior to Admission medications   Medication Sig Start Date End Date Taking? Authorizing Provider  omeprazole  (PRILOSEC) 40 MG capsule Take 1 capsule (40 mg total) by mouth daily. 05/20/23   Trevor Garnette LABOR, MD  omeprazole  (PRILOSEC) 40 MG capsule Take 1 capsule (40 mg total) by mouth daily. Take 30 minutes before breakfast. 08/07/23   Kennedy-Smith, Colleen M, NP  triamcinolone  cream (KENALOG ) 0.1 % Apply 1 Application topically 2 (two) times daily. 02/03/23   Trevor Garnette LABOR, MD    Current Outpatient Medications  Medication Sig Dispense Refill   omeprazole  (PRILOSEC) 40 MG capsule Take 1 capsule (40 mg total) by mouth daily. 30 capsule 3   triamcinolone  cream (KENALOG ) 0.1 % Apply 1 Application topically 2 (two) times daily. 135 g 3   omeprazole  (PRILOSEC) 40 MG capsule Take 1 capsule (40 mg total) by mouth daily. Take 30 minutes before breakfast. 90 capsule 1   Current Facility-Administered Medications  Medication Dose Route Frequency Provider Last Rate Last Admin   0.9 %  sodium chloride  infusion  500 mL Intravenous Once Trevor Hernandez, Trevor HERO, MD        Allergies as  of 08/17/2023 - Review Complete 08/17/2023  Allergen Reaction Noted   Penicillins  03/26/2011    Family History  Problem Relation Age of Onset   Skin cancer Mother    Diabetes Father    Colon polyps Father    Skin cancer Father    Heart disease Father    Colon cancer Neg Hx    Esophageal cancer Neg Hx    Rectal cancer Neg Hx    Stomach cancer Neg Hx     Social History   Socioeconomic History   Marital status: Married    Spouse name: Not on file   Number of children: Not on file   Years of education: Not on file   Highest education level: Not on file  Occupational History   Not on file  Tobacco Use   Smoking status: Never   Smokeless tobacco: Never  Vaping Use   Vaping status: Never Used  Substance and Sexual Activity   Alcohol use: Yes    Alcohol/week: 7.0 standard drinks of alcohol    Types: 7 Standard drinks or equivalent per week    Comment: 4 days a week   Drug use: No   Sexual activity: Yes  Other Topics Concern   Not on file  Social History Narrative   Not on file   Social Drivers of Health   Financial Resource Strain: Not on file  Food Insecurity:  Not on file  Transportation Needs: Not on file  Physical Activity: Not on file  Stress: Not on file  Social Connections: Not on file  Intimate Partner Violence: Not on file    Review of Systems:  All other review of systems negative except as mentioned in the HPI.  Physical Exam: Vital signs BP 121/74   Pulse 63   Temp 98.4 F (36.9 C)   Resp 12   Ht 5' 8 (1.727 Hernandez)   Wt 153 lb (69.4 kg)   SpO2 100%   BMI 23.26 kg/Hernandez   General:   Alert,  Well-developed, well-nourished, pleasant and cooperative in NAD Airway:  Mallampati 2 Lungs:  Clear throughout to auscultation.   Heart:  Regular rate and rhythm; no murmurs, clicks, rubs,  or gallops. Abdomen:  Soft, nontender and nondistended. Normal bowel sounds.   Neuro/Psych:  Normal mood and affect. A and O x 3  Trevor Hausen, MD [facility]  Gastroenterology

## 2023-08-17 NOTE — Patient Instructions (Addendum)
 Await pathology results.   Please call back to schedule office follow up for 4-6 weeks   YOU HAD AN ENDOSCOPIC PROCEDURE TODAY AT THE [facility]:   Refer to the procedure report that was given to you for any specific questions about what was found during the examination.  If the procedure report does not answer your questions, please call your gastroenterologist to clarify.  If you requested that your care partner not be given the details of your procedure findings, then the procedure report has been included in a sealed envelope for you to review at your convenience later.  YOU SHOULD EXPECT: Some feelings of bloating in the abdomen. Passage of more gas than usual.  Walking can help get rid of the air that was put into your GI tract during the procedure and reduce the bloating.   Please Note:  You might notice some irritation and congestion in your nose or some drainage.  This is from the oxygen used during your procedure.  There is no need for concern and it should clear up in a day or so.  SYMPTOMS TO REPORT IMMEDIATELY:  Following upper endoscopy (EGD)  Vomiting of blood or coffee ground material  New chest pain or pain under the shoulder blades  Painful or persistently difficult swallowing  New shortness of breath  Fever of 100F or higher  Black, tarry-looking stools  For urgent or emergent issues, a gastroenterologist can be reached at any hour by calling [phone]. Do not use MyChart messaging for urgent concerns.    DIET:  We do recommend a small meal at first, but then you may proceed to your regular diet.  Drink plenty of fluids but you should avoid alcoholic beverages for 24 hours.  ACTIVITY:  You should plan to take it easy for the rest of today and you should NOT DRIVE or use heavy machinery until tomorrow (because of the sedation medicines used during the test).    FOLLOW UP: Our staff will call the number listed on your records the next business day  following your procedure.  We will call around 7:15- 8:00 am to check on you and address any questions or concerns that you may have regarding the information given to you following your procedure. If we do not reach you, we will leave a message.     If any biopsies were taken you will be contacted by phone or by letter within the next 1-3 weeks.  Please call us  at [phone] if you have not heard about the biopsies in 3 weeks.    SIGNATURES/CONFIDENTIALITY: You and/or your care partner have signed paperwork which will be entered into your electronic medical record.  These signatures attest to the fact that that the information above on your After Visit Summary has been reviewed and is understood.  Full responsibility of the confidentiality of this discharge information lies with you and/or your care-partner.

## 2023-08-17 NOTE — Progress Notes (Signed)
 Pt resting comfortably. VSS. Airway intact. SBAR complete to RN. All questions answered.

## 2023-08-17 NOTE — Progress Notes (Signed)
 Called to room to assist during endoscopic procedure.  Patient ID and intended procedure confirmed with present staff. Received instructions for my participation in the procedure from the performing physician.

## 2023-08-17 NOTE — Progress Notes (Signed)
 Per Dr. Doy Hutching, pt is to have an office follow in 4-6 weeks.  I offered to set this up for him but he would like to call back tomorrow to set this up after he looks at his schedule

## 2023-08-17 NOTE — Op Note (Signed)
 [facility] Patient Name: Trevor Hernandez Procedure Date: 08/17/2023 2:19 PM MRN: [id number] Endoscopist: Inocente Hausen , MD,  Age: 53 Referring MD:  Date of Birth: March 22, 1971 Gender: Male Account #: [id number] Procedure:                Upper GI endoscopy Indications:              Abdominal pain in the left upper quadrant, Nausea Medicines:                Monitored Anesthesia Care Procedure:                Pre-Anesthesia Assessment:                           - Prior to the procedure, a History and Physical                            was performed, and patient medications and                            allergies were reviewed. The patient's tolerance of                            previous anesthesia was also reviewed. The risks                            and benefits of the procedure and the sedation                            options and risks were discussed with the patient.                            All questions were answered, and informed consent                            was obtained. Prior Anticoagulants: The patient has                            taken no anticoagulant or antiplatelet agents. ASA                            Grade Assessment: II - A patient with mild systemic                            disease. After reviewing the risks and benefits,                            the patient was deemed in satisfactory condition to                            undergo the procedure.                           After obtaining informed consent, the endoscope was  passed under direct vision. Throughout the                            procedure, the patient's blood pressure, pulse, and                            oxygen saturations were monitored continuously. The                            Olympus Scope [id number] was introduced through the                            mouth, and advanced to the second part of duodenum.                            The  upper GI endoscopy was accomplished without                            difficulty. The patient tolerated the procedure                            well. Scope In: Scope Out: Findings:                 The examined esophagus was normal.                           The gastric body, gastric antrum, cardia (on                            retroflexion) and gastric fundus (on retroflexion)                            were normal. Biopsies were taken with a cold                            forceps for Helicobacter pylori testing.                           Multiple diminutive sessile polyps were found in                            the gastric fundus and in the gastric body.                            Biopsies were taken with a cold forceps for                            histology.                           The duodenal bulb and second portion of the                            duodenum were normal. Biopsies for histology were  taken with a cold forceps for evaluation of celiac                            disease. Complications:            No immediate complications. Estimated blood loss:                            Minimal. Estimated Blood Loss:     Estimated blood loss was minimal. Impression:               - Normal esophagus.                           - Normal gastric body, antrum, cardia and gastric                            fundus. Biopsied. R/O H. pylori.                           - Multiple gastric polyps. These had the appearance                            of benign fundic gland polyps. Biopsied.                           - Normal duodenal bulb and second portion of the                            duodenum. Biopsied. R/O celiac disease. Recommendation:           - Discharge patient to home (ambulatory).                           - Await pathology results.                           - The findings and recommendations were discussed                            with the  patient's family.                           - Return to GI clinic as previously scheduled.                           - Patient has a contact number available for                            emergencies. The signs and symptoms of potential                            delayed complications were discussed with the                            patient. Return to normal activities tomorrow.  Written discharge instructions were provided to the                            patient. Inocente Hausen, MD 08/17/2023 2:56:32 PM

## 2023-08-18 ENCOUNTER — Telehealth: Payer: Self-pay

## 2023-08-18 NOTE — Telephone Encounter (Signed)
 Post procedure follow up call, no answer

## 2023-08-20 ENCOUNTER — Encounter: Payer: Self-pay | Admitting: Pediatrics

## 2023-08-20 LAB — SURGICAL PATHOLOGY

## 2023-09-21 NOTE — Progress Notes (Unsigned)
 [facility] Return Visit   Referring Provider Nelwyn Salisbury, MD [address] [place],  [place] [address]  Primary Care Provider Nelwyn Salisbury, MD  Patient Profile: Dru Primeau is a 53 y.o. male with a past medical history of hypoglycemia and psoriasis who returns to the [facility] for follow-up of the problem(s) noted below.  Problem List: LUQ abdominal pain   History of Present Illness   Mr. Kozakiewicz was last seen in the GI office 08/07/2023 by Alcide Evener, NP   Current GI Meds  Omeprazole 40 mg orally daily  Interval History  At the time of Vencent's last clinic visit he described awakening abruptly 3 months prior with left upper quadrant abdominal pain below the sternal border.  He described the pain as constant and associated with mild nausea but no vomiting. Abdominal ultrasound and CT scan of the abdomen and pelvis were overall unrevealing with the exception of moderate stool burden EGD 08/17/2023 was overall normal -no evidence of H. pylori infection or celiac disease; fundic gland polyps were noted  Harvie Heck returns to the office today reporting that he is generally feeling well He has not had an episode of abdominal pain for more than 1 month Notes mild burping and gas but symptoms are not particularly problematic States that when his symptoms initially started he had been drinking several months of black coffee a day and was also on a medication for toenail fungus which was discontinued He is not sure if there is factors contributed to his symptoms He has now been slowly reintroducing food and tolerating things such as Lactaid on the weekends, berries and salad dressing  He is now gaining weight-nadir weight was 150 pounds and he is now up to 155 pounds  No change in bowel habits-states that he is going to the bathroom normally  At today's visit we reviewed his workup which has been reassuring and ruled out  worrisome pathology. Discussed a strategy for tapering PPI in the future.   Last colonoscopy: 08/2019 - normal, IH Last endoscopy: 08/2023 - overall normal -no evidence of H. pylori infection or celiac disease; fundic gland polyps were noted  Last Abd CT/CTE/MRE: CTAP 06/19/2023 -no acute findings, moderate stool burden  GI Review of Symptoms Significant for gas and burping. Otherwise negative.  General Review of Systems  Review of systems is significant for the pertinent positives and negatives as listed per the HPI.  Full ROS is otherwise negative.  Past Medical History   Past Medical History:  Diagnosis Date   Allergy    seasonal   Epididymitis    sees Dr. Natalia Leatherwood    Hypoglycemia    sees Dr. Elvera Lennox    Psoriasis    sees Dr. Amy Swaziland     Past Surgical History   Past Surgical History:  Procedure Laterality Date   COLONOSCOPY  08/31/2019   per Dr. Russella Dar, internal hemorrhoid, no polyps, repeat in 10 yrs      Allergies and Medications   Allergies  Allergen Reactions   Penicillins     Pt's wife is highly sensitive, so he would not want to take this.   Current Meds  Medication Sig   omeprazole (PRILOSEC) 40 MG capsule Take 1 capsule (40 mg total) by mouth daily.   omeprazole (PRILOSEC) 40 MG capsule Take 1 capsule (40 mg total) by mouth daily. Take 30 minutes before breakfast.   triamcinolone cream (KENALOG) 0.1 % Apply 1 Application topically 2 (two)  times daily.     Family History   Family History  Problem Relation Age of Onset   Skin cancer Mother    Diabetes Father    Colon polyps Father    Skin cancer Father    Heart disease Father    Colon cancer Neg Hx    Esophageal cancer Neg Hx    Rectal cancer Neg Hx    Stomach cancer Neg Hx     Social History   Social History   Tobacco Use   Smoking status: Never   Smokeless tobacco: Never  Vaping Use   Vaping status: Never Used  Substance Use Topics   Alcohol use: Yes    Alcohol/week: 7.0  standard drinks of alcohol    Types: 7 Standard drinks or equivalent per week    Comment: 4 days a week   Drug use: No   Calyn reports that he has never smoked. He has never used smokeless tobacco. He reports current alcohol use of about 7.0 standard drinks of alcohol per week. He reports that he does not use drugs.  Vital Signs and Physical Examination   Vitals:   09/22/23 1009  BP: 118/62  Pulse: (!) 55   Body mass index is 23.57 kg/m. Weight: 155 lb (70.3 kg)  General: Well developed, well nourished, no acute distress Head: Normocephalic and atraumatic Eyes: Sclerae anicteric, EOMI Lungs: Clear throughout to auscultation Heart: Regular rate and rhythm; No murmurs, rubs or bruits Abdomen: Soft, non tender and non distended. No masses, hepatosplenomegaly or hernias noted. Normal Bowel sounds Rectal: Deferred Musculoskeletal: Symmetrical with no gross deformities    Review of Data  The following data was reviewed at the time of this encounter:  Laboratory Studies      Latest Ref Rng & Units 02/03/2023    8:27 AM 12/18/2021   10:19 AM 12/05/2020    8:41 AM  CBC  WBC 4.0 - 10.5 K/uL 6.2  6.8  6.3   Hemoglobin 13.0 - 17.0 g/dL 72.5  36.6  44.0   Hematocrit 39.0 - 52.0 % 46.3  46.2  44.4   Platelets 150.0 - 400.0 K/uL 225.0  229.0  227.0     No results found for: "LIPASE"    Latest Ref Rng & Units 06/15/2023    3:07 PM 02/03/2023    8:27 AM 12/18/2021   10:19 AM  CMP  Glucose 70 - 99 mg/dL 89  86  85   BUN 6 - 23 mg/dL 12  12  14    Creatinine 0.40 - 1.50 mg/dL 3.47  4.25  9.56   Sodium 135 - 145 mEq/L 141  141  139   Potassium 3.5 - 5.1 mEq/L 3.7  4.5  4.1   Chloride 96 - 112 mEq/L 104  105  102   CO2 19 - 32 mEq/L 29  28  29    Calcium 8.4 - 10.5 mg/dL 9.0  9.3  9.3   Total Protein 6.0 - 8.3 g/dL  6.8  6.9   Total Bilirubin 0.2 - 1.2 mg/dL  0.9  1.0   Alkaline Phos 39 - 117 U/L  62  69   AST 0 - 37 U/L  15  13   ALT 0 - 53 U/L  12  13      Imaging Studies   CTAP 06/2023 No acute findings, moderate stool burden  AUS 05/2023 1. No acute abnormality identified. 2. 1.2 cm echogenic calcification with shadowing in the right lobe liver near  the dome. This is nonspecific but may represent a calcified granuloma.  GI Procedures and Studies  EGD 08/2023 Overall normal -no evidence of H. pylori infection or celiac disease; fundic gland polyps were noted  Colonoscopy 08/2019 Normal, IH   Clinical Impression  It is my clinical impression that Mr. Monds is a 53 y.o. male with;  Left upper quadrant abdominal pain  Harvie Heck presented for initial evaluation in our office in January 2025 for a 71-month history of left upper quadrant abdominal pain.  The onset of his symptoms occurred in the setting of consuming black coffee as well as the use of a medication for toenail fungus-Lamisil.  He underwent extensive evaluation including laboratory testing, CT scan of the abdomen and pelvis and upper endoscopy which were largely unremarkable.  Abdominal ultrasound and CT imaging showed a 1.2 cm calcification in the right liver lobe near the dome likely a calcified granuloma.  Biopsies during his EGD did not show any evidence of celiac disease or H. pylori infection.  At today's visit, Harvie Heck is feeling much better and has noted almost complete resolution of his symptoms.  He has not had pain for more than 1 month.  On occasion will note mild burping and gas.  We reviewed while in the etiology of his pain has not been definitively identified it is reassuring that no worrisome pathology was found during his workup.  We discussed tapering his PPI to every other day dosing for at least 3 to 4 weeks to avoid rebound hyperacidity.  If symptoms remain at bay he can discontinue PPI.  If symptoms recur would recommend resuming PPI and consideration of MRI of the pancreas.  Plan  Taper omeprazole to 40 mg every other day x 3 to 4 weeks and if symptoms do not recur can cease  medication If symptoms recur recommend resuming omeprazole 40 mg orally daily and schedule abdominal MRI with focus on the pancreas Consider avoiding Lamisil in the future given possible association with symptoms. Monitor weight and anthropometrics Next screening colonoscopy due 2031.  Planned Follow Up PRN  The patient or caregiver verbalized understanding of the material covered, with no barriers to understanding. All questions were answered. Patient or caregiver is agreeable with the plan outlined above.    It was a pleasure to see Brynda Greathouse.  If you have any questions or concerns regarding this evaluation, do not hesitate to contact me.  Maren Beach, MD [facility]

## 2023-09-22 ENCOUNTER — Ambulatory Visit: Payer: 59 | Admitting: Pediatrics

## 2023-09-22 ENCOUNTER — Encounter: Payer: Self-pay | Admitting: Pediatrics

## 2023-09-22 VITALS — BP 118/62 | HR 55 | Ht 68.0 in | Wt 155.0 lb

## 2023-09-22 DIAGNOSIS — R1012 Left upper quadrant pain: Secondary | ICD-10-CM

## 2023-09-22 DIAGNOSIS — K317 Polyp of stomach and duodenum: Secondary | ICD-10-CM

## 2023-09-22 NOTE — Patient Instructions (Signed)
 Please follow up as needed.  Thank you for entrusting me with your care and for choosing [facility], Dr. Maren Beach   If your blood pressure at your visit was 140/90 or greater, please contact your primary care physician to follow up on this. ______________________________________________________  If you are age 55 or older, your body mass index should be between 23-30. Your Body mass index is 23.57 kg/m. If this is out of the aforementioned range listed, please consider follow up with your Primary Care Provider.  If you are age 46 or younger, your body mass index should be between 19-25. Your Body mass index is 23.57 kg/m. If this is out of the aformentioned range listed, please consider follow up with your Primary Care Provider.  ________________________________________________________  The [facility] providers would like to encourage you to use [facility] to communicate with providers for non-urgent requests or questions.  Due to long hold times on the telephone, sending your provider a message by [facility] may be a faster and more efficient way to get a response.  Please allow 48 business hours for a response.  Please remember that this is for non-urgent requests.  _______________________________________________________  Due to recent changes in healthcare laws, you may see the results of your imaging and laboratory studies on MyChart before your provider has had a chance to review them.  We understand that in some cases there may be results that are confusing or concerning to you. Not all laboratory results come back in the same time frame and the provider may be waiting for multiple results in order to interpret others.  Please give [place] 48 hours in order for your provider to thoroughly review all the results before contacting the office for clarification of your results.  ]

## 2023-11-15 ENCOUNTER — Other Ambulatory Visit: Payer: Self-pay | Admitting: Family Medicine

## 2023-11-15 ENCOUNTER — Other Ambulatory Visit: Payer: Self-pay | Admitting: Nurse Practitioner

## 2024-02-04 ENCOUNTER — Ambulatory Visit: Admitting: Family Medicine

## 2024-02-04 ENCOUNTER — Encounter: Payer: Self-pay | Admitting: Family Medicine

## 2024-02-04 VITALS — BP 98/60 | HR 64 | Temp 98.1°F | Ht 68.0 in | Wt 154.4 lb

## 2024-02-04 DIAGNOSIS — Z13220 Encounter for screening for lipoid disorders: Secondary | ICD-10-CM

## 2024-02-04 DIAGNOSIS — Z125 Encounter for screening for malignant neoplasm of prostate: Secondary | ICD-10-CM

## 2024-02-04 DIAGNOSIS — Z131 Encounter for screening for diabetes mellitus: Secondary | ICD-10-CM | POA: Diagnosis not present

## 2024-02-04 DIAGNOSIS — Z0000 Encounter for general adult medical examination without abnormal findings: Secondary | ICD-10-CM | POA: Diagnosis not present

## 2024-02-04 LAB — CBC WITH DIFFERENTIAL/PLATELET
Basophils Absolute: 0 10*3/uL (ref 0.0–0.1)
Basophils Relative: 0.5 % (ref 0.0–3.0)
Eosinophils Absolute: 0.1 10*3/uL (ref 0.0–0.7)
Eosinophils Relative: 2.6 % (ref 0.0–5.0)
HCT: 43.8 % (ref 39.0–52.0)
Hemoglobin: 15.4 g/dL (ref 13.0–17.0)
Lymphocytes Relative: 23.7 % (ref 12.0–46.0)
Lymphs Abs: 1.3 10*3/uL (ref 0.7–4.0)
MCHC: 35.1 g/dL (ref 30.0–36.0)
MCV: 91.3 fl (ref 78.0–100.0)
Monocytes Absolute: 0.4 10*3/uL (ref 0.1–1.0)
Monocytes Relative: 8 % (ref 3.0–12.0)
Neutro Abs: 3.6 10*3/uL (ref 1.4–7.7)
Neutrophils Relative %: 65.2 % (ref 43.0–77.0)
Platelets: 191 10*3/uL (ref 150.0–400.0)
RBC: 4.79 Mil/uL (ref 4.22–5.81)
RDW: 12.6 % (ref 11.5–15.5)
WBC: 5.5 10*3/uL (ref 4.0–10.5)

## 2024-02-04 LAB — BASIC METABOLIC PANEL WITH GFR
BUN: 12 mg/dL (ref 6–23)
CO2: 31 meq/L (ref 19–32)
Calcium: 9 mg/dL (ref 8.4–10.5)
Chloride: 104 meq/L (ref 96–112)
Creatinine, Ser: 0.82 mg/dL (ref 0.40–1.50)
GFR: 99.57 mL/min (ref >60.00)
Glucose, Bld: 79 mg/dL (ref 70–99)
Potassium: 4.3 meq/L (ref 3.5–5.1)
Sodium: 141 meq/L (ref 135–145)

## 2024-02-04 LAB — HEPATIC FUNCTION PANEL
ALT: 11 U/L (ref 0–53)
AST: 14 U/L (ref 0–37)
Albumin: 4.3 g/dL (ref 3.5–5.2)
Alkaline Phosphatase: 66 U/L (ref 39–117)
Bilirubin, Direct: 0.2 mg/dL (ref 0.0–0.3)
Total Bilirubin: 1.1 mg/dL (ref 0.2–1.2)
Total Protein: 6.6 g/dL (ref 6.0–8.3)

## 2024-02-04 LAB — PSA: PSA: 0.44 ng/mL (ref 0.10–4.00)

## 2024-02-04 LAB — LIPID PANEL
Cholesterol: 139 mg/dL (ref 0–200)
HDL: 54.6 mg/dL (ref >39.00)
LDL Cholesterol: 72 mg/dL (ref 0–99)
NonHDL: 83.94
Total CHOL/HDL Ratio: 3
Triglycerides: 58 mg/dL (ref 0.0–149.0)
VLDL: 11.6 mg/dL (ref 0.0–40.0)

## 2024-02-04 LAB — HEMOGLOBIN A1C: Hgb A1c MFr Bld: 5.4 % (ref 4.6–6.5)

## 2024-02-04 LAB — TSH: TSH: 1.02 u[IU]/mL (ref 0.35–5.50)

## 2024-02-04 NOTE — Progress Notes (Signed)
 Subjective:    Patient ID: Trevor Hernandez, male    DOB: [birth year], 53 y.o.   MRN: [id number]  HPI Here for a well exam. He feels great and has no concerns. We had been investigating a recurrent LUQ abdominal pain he was having earlier this year. He had labs, a CT scan, and an EGD which wwere all normal. We eventually concluded that this came from a combination of him drinking a lot of black coffee every morning and taking Lamisil . Once he stopped these the pain resolved. He had been taking Omeprazole  at that time, but he stopped this months ago.    Review of Systems  Constitutional: Negative.   HENT: Negative.    Eyes: Negative.   Respiratory: Negative.    Cardiovascular: Negative.   Gastrointestinal: Negative.   Genitourinary: Negative.   Musculoskeletal: Negative.   Skin: Negative.   Neurological: Negative.   Psychiatric/Behavioral: Negative.         Objective:   Physical Exam Constitutional:      General: He is not in acute distress.    Appearance: Normal appearance. He is well-developed. He is not diaphoretic.  HENT:     Head: Normocephalic and atraumatic.     Right Ear: External ear normal.     Left Ear: External ear normal.     Nose: Nose normal.     Mouth/Throat:     Pharynx: No oropharyngeal exudate.  Eyes:     General: No scleral icterus.       Right eye: No discharge.        Left eye: No discharge.     Conjunctiva/sclera: Conjunctivae normal.     Pupils: Pupils are equal, round, and reactive to light.  Neck:     Thyroid : No thyromegaly.     Vascular: No JVD.     Trachea: No tracheal deviation.  Cardiovascular:     Rate and Rhythm: Normal rate and regular rhythm.     Pulses: Normal pulses.     Heart sounds: Normal heart sounds. No murmur heard.    No friction rub. No gallop.  Pulmonary:     Effort: Pulmonary effort is normal. No respiratory distress.     Breath sounds: Normal breath sounds. No wheezing or rales.  Chest:     Chest wall: No  tenderness.  Abdominal:     General: Bowel sounds are normal. There is no distension.     Palpations: Abdomen is soft. There is no mass.     Tenderness: There is no abdominal tenderness. There is no guarding or rebound.  Genitourinary:    Penis: Normal. No tenderness.      Testes: Normal.     Prostate: Normal.     Rectum: Normal. Guaiac result negative.  Musculoskeletal:        General: No tenderness. Normal range of motion.     Cervical back: Neck supple.  Lymphadenopathy:     Cervical: No cervical adenopathy.  Skin:    General: Skin is warm and dry.     Coloration: Skin is not pale.     Findings: No erythema or rash.  Neurological:     General: No focal deficit present.     Mental Status: He is alert and oriented to person, place, and time.     Cranial Nerves: No cranial nerve deficit.     Motor: No abnormal muscle tone.     Coordination: Coordination normal.     Deep Tendon Reflexes: Reflexes are normal and symmetric.  Reflexes normal.  Psychiatric:        Mood and Affect: Mood normal.        Behavior: Behavior normal.        Thought Content: Thought content normal.        Judgment: Judgment normal.           Assessment & Plan:  Well exam. We discussed diet and exercise. Get fasting labs. Garnette Olmsted, MD

## 2024-02-08 ENCOUNTER — Ambulatory Visit: Payer: Self-pay | Admitting: Family Medicine
